# Patient Record
Sex: Female | Born: 1935
Health system: Southern US, Community
[De-identification: ages and names within clinical notes are randomized; demographics above are authoritative.]

## PROBLEM LIST (undated history)

## (undated) DIAGNOSIS — I10 Essential (primary) hypertension: Secondary | ICD-10-CM

## (undated) DIAGNOSIS — K219 Gastro-esophageal reflux disease without esophagitis: Secondary | ICD-10-CM

## (undated) DIAGNOSIS — F0390 Unspecified dementia without behavioral disturbance: Secondary | ICD-10-CM

## (undated) DIAGNOSIS — E7800 Pure hypercholesterolemia, unspecified: Secondary | ICD-10-CM

## (undated) DIAGNOSIS — E119 Type 2 diabetes mellitus without complications: Secondary | ICD-10-CM

## (undated) DIAGNOSIS — D649 Anemia, unspecified: Secondary | ICD-10-CM

## (undated) HISTORY — PX: CHOLECYSTECTOMY: SHX55

## (undated) HISTORY — DX: Gastro-esophageal reflux disease without esophagitis: K21.9

## (undated) HISTORY — DX: Anemia, unspecified: D64.9

## (undated) HISTORY — PX: ABDOMINAL HYSTERECTOMY: SHX81

## (undated) HISTORY — PX: JOINT REPLACEMENT: SHX530

---

## 1998-12-27 ENCOUNTER — Ambulatory Visit (HOSPITAL_COMMUNITY): Admission: RE | Admit: 1998-12-27 | Discharge: 1998-12-27 | Payer: Self-pay | Admitting: Ophthalmology

## 2008-07-29 ENCOUNTER — Emergency Department (HOSPITAL_BASED_OUTPATIENT_CLINIC_OR_DEPARTMENT_OTHER): Admission: EM | Admit: 2008-07-29 | Discharge: 2008-07-29 | Payer: Self-pay | Admitting: Emergency Medicine

## 2008-07-29 ENCOUNTER — Ambulatory Visit: Payer: Self-pay | Admitting: Diagnostic Radiology

## 2010-09-07 LAB — COMPREHENSIVE METABOLIC PANEL
AST: 29 U/L (ref 0–37)
BUN: 15 mg/dL (ref 6–23)
CO2: 28 mEq/L (ref 19–32)
Calcium: 8.9 mg/dL (ref 8.4–10.5)
Creatinine, Ser: 0.8 mg/dL (ref 0.4–1.2)
GFR calc Af Amer: >60 mL/min (ref >60)
GFR calc non Af Amer: >60 mL/min (ref >60)

## 2010-09-07 LAB — URINALYSIS, ROUTINE W REFLEX MICROSCOPIC
Nitrite: NEGATIVE
Specific Gravity, Urine: 1.007 (ref 1.005–1.030)
Urobilinogen, UA: 0.2 mg/dL (ref 0.0–1.0)
pH: 6 (ref 5.0–8.0)

## 2010-09-07 LAB — CBC
MCHC: 33.3 g/dL (ref 30.0–36.0)
MCV: 88.6 fL (ref 78.0–100.0)
Platelets: 148 10*3/uL — ABNORMAL LOW (ref 150–400)
RBC: 3.93 MIL/uL (ref 3.87–5.11)

## 2010-09-07 LAB — DIFFERENTIAL
Eosinophils Relative: 0 % (ref 0–5)
Lymphocytes Relative: 12 % (ref 12–46)
Lymphs Abs: 0.5 10*3/uL — ABNORMAL LOW (ref 0.7–4.0)
Neutro Abs: 3.3 10*3/uL (ref 1.7–7.7)
Neutrophils Relative %: 81 % — ABNORMAL HIGH (ref 43–77)

## 2010-09-07 LAB — LIPASE, BLOOD: Lipase: 143 U/L (ref 23–300)

## 2010-09-07 LAB — GLUCOSE, CAPILLARY: Glucose-Capillary: 222 mg/dL — ABNORMAL HIGH (ref 70–99)

## 2012-12-02 ENCOUNTER — Emergency Department (HOSPITAL_BASED_OUTPATIENT_CLINIC_OR_DEPARTMENT_OTHER)
Admission: EM | Admit: 2012-12-02 | Discharge: 2012-12-02 | Disposition: A | Discharge status: Home / Self Care | Payer: Medicare Other | Attending: Emergency Medicine | Admitting: Emergency Medicine

## 2012-12-02 ENCOUNTER — Encounter (HOSPITAL_BASED_OUTPATIENT_CLINIC_OR_DEPARTMENT_OTHER): Payer: Self-pay | Admitting: *Deleted

## 2012-12-02 ENCOUNTER — Emergency Department (HOSPITAL_BASED_OUTPATIENT_CLINIC_OR_DEPARTMENT_OTHER): Payer: Medicare Other

## 2012-12-02 DIAGNOSIS — S0180XA Unspecified open wound of other part of head, initial encounter: Secondary | ICD-10-CM | POA: Insufficient documentation

## 2012-12-02 DIAGNOSIS — Y92009 Unspecified place in unspecified non-institutional (private) residence as the place of occurrence of the external cause: Secondary | ICD-10-CM | POA: Insufficient documentation

## 2012-12-02 DIAGNOSIS — Z794 Long term (current) use of insulin: Secondary | ICD-10-CM | POA: Insufficient documentation

## 2012-12-02 DIAGNOSIS — Y9389 Activity, other specified: Secondary | ICD-10-CM | POA: Insufficient documentation

## 2012-12-02 DIAGNOSIS — S0990XA Unspecified injury of head, initial encounter: Secondary | ICD-10-CM | POA: Insufficient documentation

## 2012-12-02 DIAGNOSIS — Z79899 Other long term (current) drug therapy: Secondary | ICD-10-CM | POA: Insufficient documentation

## 2012-12-02 DIAGNOSIS — I10 Essential (primary) hypertension: Secondary | ICD-10-CM | POA: Insufficient documentation

## 2012-12-02 DIAGNOSIS — E7800 Pure hypercholesterolemia, unspecified: Secondary | ICD-10-CM | POA: Insufficient documentation

## 2012-12-02 DIAGNOSIS — Z7982 Long term (current) use of aspirin: Secondary | ICD-10-CM | POA: Insufficient documentation

## 2012-12-02 DIAGNOSIS — W19XXXA Unspecified fall, initial encounter: Secondary | ICD-10-CM

## 2012-12-02 DIAGNOSIS — R296 Repeated falls: Secondary | ICD-10-CM | POA: Insufficient documentation

## 2012-12-02 DIAGNOSIS — E119 Type 2 diabetes mellitus without complications: Secondary | ICD-10-CM | POA: Insufficient documentation

## 2012-12-02 DIAGNOSIS — Z23 Encounter for immunization: Secondary | ICD-10-CM | POA: Insufficient documentation

## 2012-12-02 HISTORY — DX: Type 2 diabetes mellitus without complications: E11.9

## 2012-12-02 HISTORY — DX: Essential (primary) hypertension: I10

## 2012-12-02 HISTORY — DX: Pure hypercholesterolemia, unspecified: E78.00

## 2012-12-02 MED ORDER — TETANUS-DIPHTH-ACELL PERTUSSIS 5-2.5-18.5 LF-MCG/0.5 IM SUSP
0.5000 mL | Freq: Once | INTRAMUSCULAR | Status: AC
  Administered 2012-12-02: 0.5 mL via INTRAMUSCULAR
  Filled 2012-12-02: qty 0.5

## 2012-12-02 NOTE — ED Provider Notes (Signed)
History    CSN: [id number] Arrival date & time 12/02/12  2039  First MD Initiated Contact with Patient 12/02/12 2201     Chief Complaint  Patient presents with  . Fall   (Consider location/radiation/quality/duration/timing/severity/associated sxs/prior Treatment) HPI Patient presents after a fall in her bathroom. She states that she lost her balance and fell. She was helped up by a family member. She did strike her head and has a laceration over her right eye. Bleeding was controlled prior to arrival. She has been at her mental status baseline according to family. No vomiting or seizure activity. She denies syncope or chest pain or severe headache prior to her fall. She does not remember her last tetanus shot. There are no other associated systemic symptoms, there are no other alleviating or modifying factors.  Past Medical History  Diagnosis Date  . Hypertension   . High cholesterol   . Diabetes mellitus without complication    Past Surgical History  Procedure Laterality Date  . Cholecystectomy    . Abdominal hysterectomy     No family history on file. History  Substance Use Topics  . Smoking status: Never Smoker   . Smokeless tobacco: Not on file  . Alcohol Use: No   OB History   Grav Para Term Preterm Abortions TAB SAB Ect Mult Living                 Review of Systems ROS reviewed and all otherwise negative except for mentioned in HPI  Allergies  Review of patient's allergies indicates no known allergies.  Home Medications   Current Outpatient Rx  Name  Route  Sig  Dispense  Refill  . aspirin 81 MG tablet   Oral   Take 81 mg by mouth daily.         Marland Kitchen atorvastatin (LIPITOR) 10 MG tablet   Oral   Take 10 mg by mouth daily.         Marland Kitchen b complex vitamins tablet   Oral   Take 1 tablet by mouth daily.         . bimatoprost (LUMIGAN) 0.01 % SOLN      1 drop at bedtime.         . brimonidine (ALPHAGAN P) 0.1 % SOLN               . dorzolamide  (TRUSOPT) 2 % ophthalmic solution      1 drop 3 (three) times daily.         Marland Kitchen gabapentin (NEURONTIN) 100 MG capsule   Oral   Take 100 mg by mouth 3 (three) times daily.         . insulin NPH-regular (NOVOLIN 70/30) (70-30) 100 UNIT/ML injection   Subcutaneous   Inject into the skin.         Marland Kitchen losartan (COZAAR) 100 MG tablet   Oral   Take 100 mg by mouth daily.         . methazolamide (NEPTAZANE) 50 MG tablet   Oral   Take 50 mg by mouth 3 (three) times daily.         . verapamil (COVERA HS) 180 MG (CO) 24 hr tablet   Oral   Take 180 mg by mouth at bedtime.          BP 122/53  Pulse 76  Temp(Src) 98.5 F (36.9 C) (Oral)  Resp 20  SpO2 100% Vitals reviewed Physical Exam Physical Examination: General appearance - alert, well  appearing, and in no distress Mental status - alert, oriented to person, place, and time Eyes - pupils equal and reactive, extraocular eye movements intact Mouth - mucous membranes moist, pharynx normal without lesions Neck - supple, no midline tenderness to palpation Chest - clear to auscultation, no wheezes, rales or rhonchi, symmetric air entry Heart - normal rate, regular rhythm, normal S1, S2, no murmurs, rubs, clicks or gallops Abdomen - soft, nontender, nondistended, no masses or organomegaly Back exam - no midline tenderness to palpation, no CVA tenderness Neurological - alert, oriented, normal speech, cranial nerves grossly intact, strength 5/5 in extremiteis x 4, sensation intact Musculoskeletal - no joint tenderness, deformity or swelling Extremities - peripheral pulses normal, no pedal edema, no clubbing or cyanosis Skin - approx 1cm horizontal laceration over right eyebrow, normal coloration and turgor, no rashes  ED Course  Procedures (including critical care time) Labs Reviewed - No data to display Ct Head Wo Contrast  12/02/2012   *RADIOLOGY REPORT*  Clinical Data: Fall, hit forehead.  Headache.  CT HEAD WITHOUT CONTRAST   Technique:  Contiguous axial images were obtained from the base of the skull through the vertex without contrast.  Comparison: None  Findings: There is atrophy and chronic small vessel disease changes. No acute intracranial abnormality.  Specifically, no hemorrhage, hydrocephalus, mass lesion, acute infarction, or significant intracranial injury.  No acute calvarial abnormality. Visualized paranasal sinuses and mastoids clear.  Orbital soft tissues unremarkable.  IMPRESSION: No acute intracranial abnormality.  Atrophy, chronic microvascular disease.   Original Report Authenticated By: Charlett Nose, M.D.   1. Fall, initial encounter   2. Head injury, initial encounter     MDM  Pt presenting with c/o fall.  She denies LOC.  She had CT of head without any acute changes.  Laceration irrigated and closed with dermabond.  No other evidence of injuries or other acute emergent conditions.  Discharged with strict return precautions.  Pt agreeable with plan.  Ethelda Chick, MD 12/04/12 [phone]

## 2012-12-02 NOTE — Discharge Instructions (Signed)
Return to the ED with any concerns including vomiting, seizure activity, fever, drainage from wound, increased redness around wound, decreased level of alertness/lethargy, or any other alarming symptoms

## 2012-12-02 NOTE — ED Notes (Signed)
unwitnessed fall. Family member found her on the floor with her cane under her. Small laceration above her right eye. No loc.

## 2013-02-21 ENCOUNTER — Inpatient Hospital Stay (HOSPITAL_BASED_OUTPATIENT_CLINIC_OR_DEPARTMENT_OTHER)
Admission: EM | Admit: 2013-02-21 | Discharge: 2013-02-24 | DRG: 085 | Disposition: A | Discharge status: Home / Self Care | Payer: Medicare Other | Attending: Neurosurgery | Admitting: Neurosurgery

## 2013-02-21 ENCOUNTER — Encounter (HOSPITAL_BASED_OUTPATIENT_CLINIC_OR_DEPARTMENT_OTHER): Payer: Self-pay

## 2013-02-21 ENCOUNTER — Emergency Department (HOSPITAL_BASED_OUTPATIENT_CLINIC_OR_DEPARTMENT_OTHER): Payer: Medicare Other

## 2013-02-21 DIAGNOSIS — W19XXXA Unspecified fall, initial encounter: Secondary | ICD-10-CM | POA: Diagnosis present

## 2013-02-21 DIAGNOSIS — E43 Unspecified severe protein-calorie malnutrition: Secondary | ICD-10-CM | POA: Insufficient documentation

## 2013-02-21 DIAGNOSIS — Z794 Long term (current) use of insulin: Secondary | ICD-10-CM

## 2013-02-21 DIAGNOSIS — Z7982 Long term (current) use of aspirin: Secondary | ICD-10-CM

## 2013-02-21 DIAGNOSIS — I10 Essential (primary) hypertension: Secondary | ICD-10-CM | POA: Diagnosis present

## 2013-02-21 DIAGNOSIS — S065XAA Traumatic subdural hemorrhage with loss of consciousness status unknown, initial encounter: Principal | ICD-10-CM | POA: Diagnosis present

## 2013-02-21 DIAGNOSIS — Z79899 Other long term (current) drug therapy: Secondary | ICD-10-CM

## 2013-02-21 DIAGNOSIS — IMO0002 Reserved for concepts with insufficient information to code with codable children: Secondary | ICD-10-CM

## 2013-02-21 DIAGNOSIS — Z9089 Acquired absence of other organs: Secondary | ICD-10-CM

## 2013-02-21 DIAGNOSIS — S065X9A Traumatic subdural hemorrhage with loss of consciousness of unspecified duration, initial encounter: Principal | ICD-10-CM | POA: Diagnosis present

## 2013-02-21 DIAGNOSIS — E119 Type 2 diabetes mellitus without complications: Secondary | ICD-10-CM | POA: Diagnosis present

## 2013-02-21 DIAGNOSIS — E7800 Pure hypercholesterolemia, unspecified: Secondary | ICD-10-CM | POA: Diagnosis present

## 2013-02-21 DIAGNOSIS — Y9210 Unspecified residential institution as the place of occurrence of the external cause: Secondary | ICD-10-CM | POA: Diagnosis present

## 2013-02-21 LAB — COMPREHENSIVE METABOLIC PANEL
ALT: <5 U/L (ref 0–35)
Albumin: 3.7 g/dL (ref 3.5–5.2)
Alkaline Phosphatase: 41 U/L (ref 39–117)
BUN: 20 mg/dL (ref 6–23)
Chloride: 101 mEq/L (ref 96–112)
GFR calc Af Amer: 89 mL/min — ABNORMAL LOW (ref >90)
Glucose, Bld: 253 mg/dL — ABNORMAL HIGH (ref 70–99)
Potassium: 3.9 mEq/L (ref 3.5–5.1)
Sodium: 137 mEq/L (ref 135–145)
Total Bilirubin: 0.4 mg/dL (ref 0.3–1.2)
Total Protein: 7.1 g/dL (ref 6.0–8.3)

## 2013-02-21 LAB — URINALYSIS, ROUTINE W REFLEX MICROSCOPIC
Bilirubin Urine: NEGATIVE
Glucose, UA: 500 mg/dL — AB
Ketones, ur: NEGATIVE mg/dL
Leukocytes, UA: NEGATIVE
Protein, ur: NEGATIVE mg/dL
pH: 7.5 (ref 5.0–8.0)

## 2013-02-21 LAB — CBC WITH DIFFERENTIAL/PLATELET
Hemoglobin: 9.5 g/dL — ABNORMAL LOW (ref 12.0–15.0)
Lymphs Abs: 1.2 10*3/uL (ref 0.7–4.0)
Monocytes Relative: 11 % (ref 3–12)
Neutro Abs: 3.1 10*3/uL (ref 1.7–7.7)
Neutrophils Relative %: 65 % (ref 43–77)
Platelets: 168 10*3/uL (ref 150–400)
RBC: 3.22 MIL/uL — ABNORMAL LOW (ref 3.87–5.11)
WBC: 4.8 10*3/uL (ref 4.0–10.5)

## 2013-02-21 NOTE — ED Provider Notes (Signed)
CSN: [id number]     Arrival date & time 02/21/13  2004 History  This chart was scribed for Rolan Bucco, MD by Greggory Stallion, ED Scribe. This patient was seen in room MH02/MH02 and the patient's care was started at 9:49 PM.   Chief Complaint  Patient presents with  . Fall   The history is provided by the patient. No language interpreter was used.    HPI Comments: Katelyn Suarez is a 79 y.o. female who presents to the Emergency Department complaining of two falls that occurred earlier today. She states she is having left great toe pain. Pt denies any other pain or hitting her head. Pt's great grandson states he found her about to eat a detergent pod so he's concerned about neurological problems. Pt's memory has been getting worse over time. Pt's great grandson states she has been seen by her PCP about her memory and was taken off some medications and started on a new medication. He states the patient has been falling more over the last few weeks. She does live at home alone but her relative stay with her on a regular basis. Pt denies difficulty urinating, abdominal pain, nausea, emesis, CP and SOB.    Past Medical History  Diagnosis Date  . Hypertension   . High cholesterol   . Diabetes mellitus without complication    Past Surgical History  Procedure Laterality Date  . Cholecystectomy    . Abdominal hysterectomy     No family history on file. History  Substance Use Topics  . Smoking status: Never Smoker   . Smokeless tobacco: Not on file  . Alcohol Use: No   OB History   Grav Para Term Preterm Abortions TAB SAB Ect Mult Living                 Review of Systems  Constitutional: Negative for fever, chills, diaphoresis and fatigue.  HENT: Negative for congestion, rhinorrhea and sneezing.   Eyes: Negative.   Respiratory: Negative for cough, chest tightness and shortness of breath.   Cardiovascular: Negative for chest pain and leg swelling.  Gastrointestinal: Negative for  nausea, vomiting, abdominal pain, diarrhea and blood in stool.  Genitourinary: Negative for frequency, hematuria, flank pain and difficulty urinating.  Musculoskeletal: Positive for arthralgias (left great toe). Negative for back pain.  Skin: Negative for rash.  Neurological: Negative for dizziness, speech difficulty, weakness, numbness and headaches.    Allergies  Lexapro  Home Medications   Current Outpatient Rx  Name  Route  Sig  Dispense  Refill  . aspirin 81 MG tablet   Oral   Take 81 mg by mouth daily.         Marland Kitchen atorvastatin (LIPITOR) 10 MG tablet   Oral   Take 10 mg by mouth daily.         Marland Kitchen b complex vitamins tablet   Oral   Take 1 tablet by mouth daily.         . bimatoprost (LUMIGAN) 0.01 % SOLN      1 drop at bedtime.         . brimonidine (ALPHAGAN P) 0.1 % SOLN               . dorzolamide (TRUSOPT) 2 % ophthalmic solution      1 drop 3 (three) times daily.         Marland Kitchen gabapentin (NEURONTIN) 100 MG capsule   Oral   Take 100 mg by mouth 3 (three)  times daily.         . insulin NPH-regular (NOVOLIN 70/30) (70-30) 100 UNIT/ML injection   Subcutaneous   Inject into the skin.         Marland Kitchen losartan (COZAAR) 100 MG tablet   Oral   Take 100 mg by mouth daily.         . methazolamide (NEPTAZANE) 50 MG tablet   Oral   Take 50 mg by mouth 3 (three) times daily.         . verapamil (COVERA HS) 180 MG (CO) 24 hr tablet   Oral   Take 180 mg by mouth at bedtime.          BP 176/59  Pulse 77  Temp(Src) 98 F (36.7 C) (Oral)  Resp 16  Ht 5\' 7"  (1.702 m)  Wt 125 lb (56.7 kg)  BMI 19.57 kg/m2  SpO2 100%  Physical Exam  Constitutional: She is oriented to person, place, and time. She appears well-developed and well-nourished.  HENT:  Head: Normocephalic and atraumatic.  Eyes: Pupils are equal, round, and reactive to light.  Neck: Normal range of motion. Neck supple.  Cardiovascular: Normal rate, regular rhythm and normal heart sounds.    Pulmonary/Chest: Effort normal and breath sounds normal. No respiratory distress. She has no wheezes. She has no rales. She exhibits no tenderness.  Abdominal: Soft. Bowel sounds are normal. There is no tenderness. There is no rebound and no guarding.  Musculoskeletal: Normal range of motion. She exhibits no edema.  No pain along spine. No pain on palpation or ROM on extremities, including the hips.   Lymphadenopathy:    She has no cervical adenopathy.  Neurological: She is alert and oriented to person, place, and time. She has normal strength. No cranial nerve deficit or sensory deficit. GCS eye subscore is 4. GCS verbal subscore is 4. GCS motor subscore is 6.  Alert and answers questions appropriately but confused. Finger to nose intact.   Skin: Skin is warm and dry. No rash noted.  Psychiatric: She has a normal mood and affect.    ED Course  Procedures (including critical care time)  DIAGNOSTIC STUDIES: Oxygen Saturation is 100% on RA, normal by my interpretation.    COORDINATION OF CARE: 9:59 PM-Discussed treatment plan which includes blood work, head CT and UTI with pt at bedside and pt agreed to plan. If everything is normal, advised family to have pt follow up with her doctor.   Labs Review Results for orders placed during the hospital encounter of 02/21/13  CBC WITH DIFFERENTIAL      Result Value Range   WBC 4.8  4.0 - 10.5 K/uL   RBC 3.22 (*) 3.87 - 5.11 MIL/uL   Hemoglobin 9.5 (*) 12.0 - 15.0 g/dL   HCT 16.1 (*) 09.6 - 04.5 %   MCV 89.1  78.0 - 100.0 fL   MCH 29.5  26.0 - 34.0 pg   MCHC 33.1  30.0 - 36.0 g/dL   RDW 40.9  81.1 - 91.4 %   Platelets 168  150 - 400 K/uL   Neutrophils Relative % 65  43 - 77 %   Neutro Abs 3.1  1.7 - 7.7 K/uL   Lymphocytes Relative 24  12 - 46 %   Lymphs Abs 1.2  0.7 - 4.0 K/uL   Monocytes Relative 11  3 - 12 %   Monocytes Absolute 0.5  0.1 - 1.0 K/uL   Eosinophils Relative 1  0 - 5 %  Eosinophils Absolute 0.0  0.0 - 0.7 K/uL    Basophils Relative 0  0 - 1 %   Basophils Absolute 0.0  0.0 - 0.1 K/uL  COMPREHENSIVE METABOLIC PANEL      Result Value Range   Sodium 137  135 - 145 mEq/L   Potassium 3.9  3.5 - 5.1 mEq/L   Chloride 101  96 - 112 mEq/L   CO2 25  19 - 32 mEq/L   Glucose, Bld 253 (*) 70 - 99 mg/dL   BUN 20  6 - 23 mg/dL   Creatinine, Ser 1.61  0.50 - 1.10 mg/dL   Calcium 9.7  8.4 - 09.6 mg/dL   Total Protein 7.1  6.0 - 8.3 g/dL   Albumin 3.7  3.5 - 5.2 g/dL   AST 14  0 - 37 U/L   ALT <5  0 - 35 U/L   Alkaline Phosphatase 41  39 - 117 U/L   Total Bilirubin 0.4  0.3 - 1.2 mg/dL   GFR calc non Af Amer 77 (*) >90 mL/min   GFR calc Af Amer 89 (*) >90 mL/min  URINALYSIS, ROUTINE W REFLEX MICROSCOPIC      Result Value Range   Color, Urine YELLOW  YELLOW   APPearance CLOUDY (*) CLEAR   Specific Gravity, Urine 1.017  1.005 - 1.030   pH 7.5  5.0 - 8.0   Glucose, UA 500 (*) NEGATIVE mg/dL   Hgb urine dipstick NEGATIVE  NEGATIVE   Bilirubin Urine NEGATIVE  NEGATIVE   Ketones, ur NEGATIVE  NEGATIVE mg/dL   Protein, ur NEGATIVE  NEGATIVE mg/dL   Urobilinogen, UA 1.0  0.0 - 1.0 mg/dL   Nitrite NEGATIVE  NEGATIVE   Leukocytes, UA NEGATIVE  NEGATIVE   Ct Head Wo Contrast  02/21/2013   *RADIOLOGY REPORT*  Clinical Data: Fall, confusion and altered mental status.  CT HEAD WITHOUT CONTRAST  Technique:  Contiguous axial images were obtained from the base of the skull through the vertex without contrast.  Comparison: 12/02/2012 head CT  Findings: A new 5 mm posterior right subdural hematoma is identified.  A small focus of increased density is noted in this subdural collection and compatible with a small area of acute hemorrhage on a larger subacute / chronic component.  Atrophy and chronic small vessel white matter ischemic changes are again noted. There is no evidence of midline shift, acute infarction or hydrocephalus.  The bony calvarium is unremarkable. Mucosal thickening within the left maxillary sinus noted as  well as post-traumatic/postsurgical changes of the right globe.  IMPRESSION: 5 mm posterior parietal subdural hematoma - new since 12/02/2012 and appears to have a small acute component on a larger subacute / chronic component.  Atrophy and chronic small vessel white matter ischemic changes.  Critical Value/emergent results were called by telephone at the time of interpretation on 02/21/2013 at 11:05 p.m. to Dr. Read Drivers, who verbally acknowledged these results.   Original Report Authenticated By: Harmon Pier, M.D.    Imaging Review Ct Head Wo Contrast  02/21/2013   *RADIOLOGY REPORT*  Clinical Data: Fall, confusion and altered mental status.  CT HEAD WITHOUT CONTRAST  Technique:  Contiguous axial images were obtained from the base of the skull through the vertex without contrast.  Comparison: 12/02/2012 head CT  Findings: A new 5 mm posterior right subdural hematoma is identified.  A small focus of increased density is noted in this subdural collection and compatible with a small area of acute hemorrhage on a  larger subacute / chronic component.  Atrophy and chronic small vessel white matter ischemic changes are again noted. There is no evidence of midline shift, acute infarction or hydrocephalus.  The bony calvarium is unremarkable. Mucosal thickening within the left maxillary sinus noted as well as post-traumatic/postsurgical changes of the right globe.  IMPRESSION: 5 mm posterior parietal subdural hematoma - new since 12/02/2012 and appears to have a small acute component on a larger subacute / chronic component.  Atrophy and chronic small vessel white matter ischemic changes.  Critical Value/emergent results were called by telephone at the time of interpretation on 02/21/2013 at 11:05 p.m. to Dr. Read Drivers, who verbally acknowledged these results.   Original Report Authenticated By: Harmon Pier, M.D.    MDM   1. Subdural hematoma    Patient was accepted by Dr. Jeral Fruit to the neurosurgical ICU     I  personally performed the services described in this documentation, which was scribed in my presence.  The recorded information has been reviewed and considered.   Rolan Bucco, MD 02/21/13 2350

## 2013-02-21 NOTE — ED Notes (Signed)
Great grandson reports patient has fallen two times today.  Unsure if patient hit head.  Pt denies pain or hitting head.  Pt is alert and oriented to self and situation but not time.

## 2013-02-21 NOTE — ED Notes (Signed)
I took patient to restroom via "Stedy" transport device, patient urinated well, I then helped patient back to bed, patient resting comfortably.

## 2013-02-22 ENCOUNTER — Inpatient Hospital Stay (HOSPITAL_COMMUNITY): Payer: Medicare Other

## 2013-02-22 LAB — MRSA PCR SCREENING: MRSA by PCR: NEGATIVE

## 2013-02-22 LAB — GLUCOSE, CAPILLARY
Glucose-Capillary: 186 mg/dL — ABNORMAL HIGH (ref 70–99)
Glucose-Capillary: 197 mg/dL — ABNORMAL HIGH (ref 70–99)
Glucose-Capillary: 218 mg/dL — ABNORMAL HIGH (ref 70–99)

## 2013-02-22 LAB — APTT: aPTT: 25 seconds (ref 24–37)

## 2013-02-22 MED ORDER — INSULIN ASPART 100 UNIT/ML ~~LOC~~ SOLN
0.0000 [IU] | Freq: Three times a day (TID) | SUBCUTANEOUS | Status: DC
  Administered 2013-02-22: 3 [IU] via SUBCUTANEOUS
  Administered 2013-02-22: 5 [IU] via SUBCUTANEOUS
  Administered 2013-02-22: 3 [IU] via SUBCUTANEOUS
  Administered 2013-02-23: 5 [IU] via SUBCUTANEOUS
  Administered 2013-02-23: 3 [IU] via SUBCUTANEOUS
  Administered 2013-02-24: 8 [IU] via SUBCUTANEOUS
  Administered 2013-02-24: 3 [IU] via SUBCUTANEOUS
  Administered 2013-02-24: 11 [IU] via SUBCUTANEOUS

## 2013-02-22 MED ORDER — HYDRALAZINE HCL 20 MG/ML IJ SOLN: 20.0000 mg | INTRAMUSCULAR | Status: DC | PRN

## 2013-02-22 NOTE — ED Notes (Signed)
Pt placed on cardiac monitor, bp and pulse ox.

## 2013-02-22 NOTE — Plan of Care (Signed)
Problem: Consults Goal: Intracranial Hemorrhage Patient Education See Patient Education Module for education specifics. Outcome: Progressing Observing patient, admitted from [facility] secondary to a fall, with 5mm post. Parietal SDH, no MLS. Goal: Diabetes Guidelines if Diabetic/Glucose > 140 If diabetic or lab glucose is > 140 mg/dl - Initiate Diabetes/Hyperglycemia Guidelines & Document Interventions  Outcome: Progressing CBGs obtained and monitored.  Problem: Acute Treatment Outcomes Goal: Neuro exam at baseline or improved Outcome: Progressing GCS-14. Reoriented, continuing to monitor.

## 2013-02-22 NOTE — ED Notes (Signed)
I have assisted patient to use restroom a total of five times in and out of bed, into "Stedy" etc. Patient has had good flow, no difficulty urinating.

## 2013-02-22 NOTE — H&P (Signed)
Mazal Judie Petit Eberlin is an 79 y.o. female.   Chief Complaint: sdh HPI: patient taken to [facility] because confusion. She had a ct head which showed a small sdh with no shift and because this finding she was send to [place] for further treatment. Back in 7/14 a ct head was negative  Past Medical History  Diagnosis Date  . Hypertension   . High cholesterol   . Diabetes mellitus without complication     Past Surgical History  Procedure Laterality Date  . Cholecystectomy    . Abdominal hysterectomy      No family history on file. Social History:  reports that she has never smoked. She does not have any smokeless tobacco history on file. She reports that she does not drink alcohol or use illicit drugs.  Allergies: No Known Allergies  Medications Prior to Admission  Medication Sig Dispense Refill  . escitalopram (LEXAPRO) 10 MG tablet Take 10 mg by mouth daily.      Marland Kitchen aspirin 81 MG tablet Take 81 mg by mouth daily.      Marland Kitchen atorvastatin (LIPITOR) 10 MG tablet Take 10 mg by mouth daily.      Marland Kitchen b complex vitamins tablet Take 1 tablet by mouth daily.      . bimatoprost (LUMIGAN) 0.01 % SOLN 1 drop at bedtime.      . brimonidine (ALPHAGAN P) 0.1 % SOLN       . dorzolamide (TRUSOPT) 2 % ophthalmic solution 1 drop 3 (three) times daily.      Marland Kitchen gabapentin (NEURONTIN) 100 MG capsule Take 100 mg by mouth 3 (three) times daily.      . insulin NPH-regular (NOVOLIN 70/30) (70-30) 100 UNIT/ML injection Inject into the skin.      Marland Kitchen losartan (COZAAR) 100 MG tablet Take 100 mg by mouth daily.      . methazolamide (NEPTAZANE) 50 MG tablet Take 50 mg by mouth 3 (three) times daily.      . verapamil (COVERA HS) 180 MG (CO) 24 hr tablet Take 180 mg by mouth at bedtime.        Results for orders placed during the hospital encounter of 02/21/13 (from the past 48 hour(s))  CBC WITH DIFFERENTIAL     Status: Abnormal   Collection Time    02/21/13 10:12 PM      Result Value Range   WBC 4.8  4.0 - 10.5 K/uL    RBC 3.22 (*) 3.87 - 5.11 MIL/uL   Hemoglobin 9.5 (*) 12.0 - 15.0 g/dL   HCT 96.0 (*) 45.4 - 09.8 %   MCV 89.1  78.0 - 100.0 fL   MCH 29.5  26.0 - 34.0 pg   MCHC 33.1  30.0 - 36.0 g/dL   RDW 11.9  14.7 - 82.9 %   Platelets 168  150 - 400 K/uL   Neutrophils Relative % 65  43 - 77 %   Neutro Abs 3.1  1.7 - 7.7 K/uL   Lymphocytes Relative 24  12 - 46 %   Lymphs Abs 1.2  0.7 - 4.0 K/uL   Monocytes Relative 11  3 - 12 %   Monocytes Absolute 0.5  0.1 - 1.0 K/uL   Eosinophils Relative 1  0 - 5 %   Eosinophils Absolute 0.0  0.0 - 0.7 K/uL   Basophils Relative 0  0 - 1 %   Basophils Absolute 0.0  0.0 - 0.1 K/uL  COMPREHENSIVE METABOLIC PANEL     Status: Abnormal  Collection Time    02/21/13 10:12 PM      Result Value Range   Sodium 137  135 - 145 mEq/L   Potassium 3.9  3.5 - 5.1 mEq/L   Chloride 101  96 - 112 mEq/L   CO2 25  19 - 32 mEq/L   Glucose, Bld 253 (*) 70 - 99 mg/dL   BUN 20  6 - 23 mg/dL   Creatinine, Ser 9.14  0.50 - 1.10 mg/dL   Calcium 9.7  8.4 - 78.2 mg/dL   Total Protein 7.1  6.0 - 8.3 g/dL   Albumin 3.7  3.5 - 5.2 g/dL   AST 14  0 - 37 U/L   ALT <5  0 - 35 U/L   Alkaline Phosphatase 41  39 - 117 U/L   Total Bilirubin 0.4  0.3 - 1.2 mg/dL   GFR calc non Af Amer 77 (*) >90 mL/min   GFR calc Af Amer 89 (*) >90 mL/min   Comment: (NOTE)     The eGFR has been calculated using the CKD EPI equation.     This calculation has not been validated in all clinical situations.     eGFR's persistently <90 mL/min signify possible Chronic Kidney     Disease.  URINALYSIS, ROUTINE W REFLEX MICROSCOPIC     Status: Abnormal   Collection Time    02/21/13 10:52 PM      Result Value Range   Color, Urine YELLOW  YELLOW   APPearance CLOUDY (*) CLEAR   Specific Gravity, Urine 1.017  1.005 - 1.030   pH 7.5  5.0 - 8.0   Glucose, UA 500 (*) NEGATIVE mg/dL   Hgb urine dipstick NEGATIVE  NEGATIVE   Bilirubin Urine NEGATIVE  NEGATIVE   Ketones, ur NEGATIVE  NEGATIVE mg/dL   Protein, ur  NEGATIVE  NEGATIVE mg/dL   Urobilinogen, UA 1.0  0.0 - 1.0 mg/dL   Nitrite NEGATIVE  NEGATIVE   Leukocytes, UA NEGATIVE  NEGATIVE   Comment: MICROSCOPIC NOT DONE ON URINES WITH NEGATIVE PROTEIN, BLOOD, LEUKOCYTES, NITRITE, OR GLUCOSE <1000 mg/dL.  PROTIME-INR     Status: None   Collection Time    02/22/13 12:00 AM      Result Value Range   Prothrombin Time 13.3  11.6 - 15.2 seconds   INR 1.03  0.00 - 1.49  APTT     Status: None   Collection Time    02/22/13 12:00 AM      Result Value Range   aPTT 25  24 - 37 seconds  MRSA PCR SCREENING     Status: None   Collection Time    02/22/13  2:19 AM      Result Value Range   MRSA by PCR NEGATIVE  NEGATIVE   Comment:            The GeneXpert MRSA Assay (FDA     approved for NASAL specimens     only), is one component of a     comprehensive MRSA colonization     surveillance program. It is not     intended to diagnose MRSA     infection nor to guide or     monitor treatment for     MRSA infections.  GLUCOSE, CAPILLARY     Status: Abnormal   Collection Time    02/22/13  2:32 AM      Result Value Range   Glucose-Capillary 197 (*) 70 - 99 mg/dL  GLUCOSE, CAPILLARY  Status: Abnormal   Collection Time    02/22/13  8:09 AM      Result Value Range   Glucose-Capillary 214 (*) 70 - 99 mg/dL   Comment 1 Notify RN     Ct Head Wo Contrast  02/22/2013   CLINICAL DATA:  79 year old female with confusion. Subdural hematoma. Subsequent encounter.  EXAM: CT HEAD WITHOUT CONTRAST  TECHNIQUE: Contiguous axial images were obtained from the base of the skull through the vertex without intravenous contrast.  COMPARISON:  02/21/2013 and earlier.  FINDINGS: Chronic changes to the right globe. Stable orbit and scalp soft tissues. Stable visualized osseous structures. Visualized paranasal sinuses and mastoids are clear.  Calcified atherosclerosis at the skull base. Decreasing small right posterior convexity mixed density extra-axial collection (series 2,  image 20). No significant mass effect on the right hemisphere. No midline shift.  No intraventricular hemorrhage. Stable ventricle size and configuration. No other intracranial mass lesion. Stable gray-white matter differentiation throughout the brain. Questionable small focus of encephalomalacia along the left sylvian fissure re- identified (series 2, image 14). No evidence of cortically based acute infarction identified. No suspicious intracranial vascular hyperdensity.  IMPRESSION: 1. Small and decreasing right posterior convexity subdural hematoma (series 2, image 20). No significant mass effect.  2.  Otherwise stable. No new intracranial abnormality.   Electronically Signed   By: Augusto Gamble M.D.   On: 02/22/2013 10:39   Ct Head Wo Contrast  02/21/2013   *RADIOLOGY REPORT*  Clinical Data: Fall, confusion and altered mental status.  CT HEAD WITHOUT CONTRAST  Technique:  Contiguous axial images were obtained from the base of the skull through the vertex without contrast.  Comparison: 12/02/2012 head CT  Findings: A new 5 mm posterior right subdural hematoma is identified.  A small focus of increased density is noted in this subdural collection and compatible with a small area of acute hemorrhage on a larger subacute / chronic component.  Atrophy and chronic small vessel white matter ischemic changes are again noted. There is no evidence of midline shift, acute infarction or hydrocephalus.  The bony calvarium is unremarkable. Mucosal thickening within the left maxillary sinus noted as well as post-traumatic/postsurgical changes of the right globe.  IMPRESSION: 5 mm posterior parietal subdural hematoma - new since 12/02/2012 and appears to have a small acute component on a larger subacute / chronic component.  Atrophy and chronic small vessel white matter ischemic changes.  Critical Value/emergent results were called by telephone at the time of interpretation on 02/21/2013 at 11:05 p.m. to Dr. Read Drivers, who verbally  acknowledged these results.   Original Report Authenticated By: Harmon Pier, M.D.    Review of Systems  Constitutional: Negative.   Eyes: Negative.   Respiratory: Negative.   Cardiovascular: Negative.   Gastrointestinal: Negative.   Genitourinary: Negative.   Musculoskeletal: Negative.   Skin: Negative.   Neurological: Positive for headaches.  Endo/Heme/Allergies: Negative.   Psychiatric/Behavioral: Positive for memory loss.    Blood pressure 157/55, pulse 84, temperature 98.1 F (36.7 C), temperature source Axillary, resp. rate 16, height 5\' 7"  (1.702 m), weight 56.7 kg (125 lb), SpO2 99.00%. Physical Exam hent, no evidence of trauma. Neck, nl. Cv, nl. Lugns, clear. Abdomen, soft. Extremities, nl. NEURO disoriented in time and space. Cn, wnl. Able to move all 4 extremities, sensory, wn. Ct smalll sdh with no shift  Assessment/Plan Observation. Not a surgical lesion. Will repeat the ct in am . Spoke with family members.  Robley Matassa M 02/22/2013, 10:52 AM

## 2013-02-22 NOTE — Progress Notes (Signed)
Sitter was giving patient a bath and noticed that the patient's bed pad was saturated with urine. I checked the fluid level in the foley balloon and drew back 9 mL. I advanced the foley catheter and put 10 mL into the balloon.  Will continue to monitor urine output to ensure that foley is effectively emptying the patient's bladder.

## 2013-02-23 ENCOUNTER — Inpatient Hospital Stay (HOSPITAL_COMMUNITY): Payer: Medicare Other

## 2013-02-23 LAB — GLUCOSE, CAPILLARY: Glucose-Capillary: 198 mg/dL — ABNORMAL HIGH (ref 70–99)

## 2013-02-23 MED ORDER — BOOST / RESOURCE BREEZE PO LIQD
1.0000 | Freq: Three times a day (TID) | ORAL | Status: DC
  Administered 2013-02-23 – 2013-02-24 (×4): 1 via ORAL

## 2013-02-23 NOTE — Progress Notes (Signed)
Patient ID: Katelyn Suarez, female   DOB: [birth year], 79 y.o.   MRN: [id number] Ct head no changes. Neuro stable. i did speak with a family member. To go to the floor and dc most likely in am

## 2013-02-23 NOTE — Progress Notes (Signed)
MD notified of patient's low UOP. No new orders at this time. Will encourage PO fluids.

## 2013-02-23 NOTE — Progress Notes (Signed)
INITIAL NUTRITION ASSESSMENT  DOCUMENTATION CODES Per approved criteria  -Severe malnutrition in the context of chronic illness   INTERVENTION: Resource Breeze po TID, each supplement provides 250 kcal and 9 grams of protein.  NUTRITION DIAGNOSIS: Malnutrition related to chronic illness as evidenced by 8% weight loss x 2 months, severe fat and muscle wasting, and intake <75% of her estimated needs in >/= 1 month.   Goal: Pt to meet >/= 90% of their estimated nutrition needs   Monitor:  PO intake, supplement acceptance, weight trend  Reason for Assessment: Pt identified as at nutrition risk on the Malnutrition Screen Tool  79 y.o. female  Admitting Dx: <principal problem not specified>  ASSESSMENT: Pt lives with grandson. Daughter at bedside and helped provide history. Pt's usual weight has been 138 lb she began to lose weight but between July and September pt lost 10 lbs. (loss of 8% of her body weight within 2 months). Pt has been eating poorly. Breakfast is usually egg and grits which pt has onl been eating 1/2 of. Lunch and Laural Golden is usually a vegetable plate from [employer] which she eats 1/2 of at [employer] and the other 1/2 at dinner. Per daughter she does not always finish the meal.  Pt has ensure at home that her family has encouraged her to consume but she does not like it. Pt does not like milk and feel they are too similar. Pt would be open to trying [employer].   Nutrition Focused Physical Exam:  Subcutaneous Fat:  Orbital Region: WNL Upper Arm Region: WNL Thoracic and Lumbar Region: severe wasting  Muscle:  Temple Region: severe wasting Clavicle Bone Region: severe wasting Clavicle and Acromion Bone Region: severe wasting Scapular Bone Region: severe wasting Dorsal Hand: severe wasting Patellar Region: mild-moderate wasting Anterior Thigh Region: mild-moderate wasting Posterior Calf Region: mild-moderate wasting  Edema: not present   Height: Ht Readings from  Last 1 Encounters:  02/21/13 5\' 7"  (1.702 m)    Weight: Wt Readings from Last 1 Encounters:  02/21/13 125 lb (56.7 kg)    Ideal Body Weight: 61.3 kg   % Ideal Body Weight: 92%  Wt Readings from Last 10 Encounters:  02/21/13 125 lb (56.7 kg)    Usual Body Weight: 138 lb  % Usual Body Weight: 91%  BMI:  Body mass index is 19.57 kg/(m^2).  Estimated Nutritional Needs: Kcal: 1400-1600 Protein: 70-80 grams Fluid: >1.5 L/day  Skin: abrasion left shin  Diet Order: Carb Control Meal Completion: <50%   EDUCATION NEEDS: -No education needs identified at this time   Intake/Output Summary (Last 24 hours) at 02/23/13 1113 Last data filed at 02/23/13 0800  Gross per 24 hour  Intake    100 ml  Output    500 ml  Net   -400 ml    Last BM: 9/27   Labs:   Recent Labs Lab 02/21/13 2212  NA 137  K 3.9  CL 101  CO2 25  BUN 20  CREATININE 0.70  CALCIUM 9.7  GLUCOSE 253*    CBG (last 3)   Recent Labs  02/22/13 2010 02/22/13 2344 02/23/13 0817  GLUCAP 177* 218* 198*    Scheduled Meds: . insulin aspart  0-15 Units Subcutaneous TID WC    Continuous Infusions:   Past Medical History  Diagnosis Date  . Hypertension   . High cholesterol   . Diabetes mellitus without complication     Past Surgical History  Procedure Laterality Date  . Cholecystectomy    .  Abdominal hysterectomy      Kendell BaneHeather Kariya Lavergne RD, LDN, CNSC [phone] Pager [phone] After Hours Pager

## 2013-02-23 NOTE — Progress Notes (Signed)
Inpatient Diabetes Program Recommendations  AACE/ADA: New Consensus Statement on Inpatient Glycemic Control (2013)  Target Ranges:  Prepandial:   less than 140 mg/dL      Peak postprandial:   less than 180 mg/dL (1-2 hours)      Critically ill patients:  140 - 180 mg/dL   Reason for Visit: Results for ALVINA, STROTHER (MRN [id number]) as of 02/23/2013 08:44  Ref. Range 02/22/2013 08:09 02/22/2013 11:52 02/22/2013 15:36 02/22/2013 20:10 02/22/2013 23:44  Glucose-Capillary Latest Range: 70-99 mg/dL 409 (H) 811 (H) 914 (H) 177 (H) 218 (H)   Please add Levemir 10 units daily while in the hospital.  Beryl Meager, RN, BC-ADM Inpatient Diabetes Coordinator Pager [phone]

## 2013-02-24 DIAGNOSIS — E43 Unspecified severe protein-calorie malnutrition: Secondary | ICD-10-CM | POA: Insufficient documentation

## 2013-02-24 LAB — GLUCOSE, CAPILLARY: Glucose-Capillary: 270 mg/dL — ABNORMAL HIGH (ref 70–99)

## 2013-02-24 NOTE — Progress Notes (Signed)
Patient is discharged from room 4N14 at this time. Alert and in stable condition. IV site d/c'd and also foley cath. Instructions read to both patient and great grandson and understanding verbalized. Great grandson signed instructions. Left facility via wheelchair and belongings with her.

## 2013-02-24 NOTE — Care Management Note (Signed)
    Page 1 of 1   02/24/2013     3:22:10 PM   CARE MANAGEMENT NOTE 02/24/2013  Patient:  LACHINA, SALSBERRY   Account Number:  [id number]  Date Initiated:  02/24/2013  Documentation initiated by:  Elmer Bales  Subjective/Objective Assessment:   Patient admitted for fall, SDH. Lives at home with family     Action/Plan:   Will follow for discharge needs.   Anticipated DC Date:  02/24/2013   Anticipated DC Plan:  HOME/SELF CARE      DC Planning Services  CM consult      Choice offered to / List presented to:             Status of service:  Completed, signed off Medicare Important Message given?   (If response is "NO", the following Medicare IM given date fields will be blank) Date Medicare IM given:   Date Additional Medicare IM given:    Discharge Disposition:  HOME/SELF CARE  Per UR Regulation:  Reviewed for med. necessity/level of care/duration of stay  If discussed at Long Length of Stay Meetings, dates discussed:    Comments:  02/24/13 1500 Elmer Bales RN, MSN, CM- Spoke with patient's RN, who states that family is requesting a wheelchair for home use.  Per Dr Jeral Fruit, patient may request this through her PCP.  Patient's son verified that patient has a cane and walker at home and has a follow-up appointment scheduled with her PCP next week.  Family does not feel that patient needs any additional services. Patient and family were encouraged to discuss any home needs with the PCP. RN updated.

## 2013-02-24 NOTE — Progress Notes (Signed)
Inpatient Diabetes Program Recommendations  AACE/ADA: New Consensus Statement on Inpatient Glycemic Control (2013)  Target Ranges:  Prepandial:   less than 140 mg/dL      Peak postprandial:   less than 180 mg/dL (1-2 hours)      Critically ill patients:  140 - 180 mg/dL     Results for ASSATA, JUNCAJ (MRN [id number]) as of 02/24/2013 12:34  Ref. Range 02/23/2013 08:17 02/23/2013 11:59 02/23/2013 17:12 02/23/2013 21:20  Glucose-Capillary Latest Range: 70-99 mg/dL 403 (H) 474 (H) 259 (H) 392 (H)    Results for ADELEINE, PASK (MRN [id number]) as of 02/24/2013 12:34  Ref. Range 02/24/2013 07:49 02/24/2013 11:46  Glucose-Capillary Latest Range: 70-99 mg/dL 329 (H) 518 (H)    **MD- Please add patient's home dose of 70/30 insulin- 15 units QAM with breakfast   Will follow. Ambrose Finland RN, MSN, CDE Diabetes Coordinator Inpatient Diabetes Program Team Pager: [phone] (8a-10p)

## 2013-02-24 NOTE — Evaluation (Signed)
Physical Therapy Evaluation Patient Details Name: Katelyn Suarez MRN: [id number] DOB: November 29, 1925 Today's Date: 02/24/2013 Time: 1914-7829 PT Time Calculation (min): 32 min  PT Assessment / Plan / Recommendation History of Present Illness  pt presents with multiple recent falls sustaining small SDH.    Clinical Impression  Pt's daughter present during eval and indicate plan is for pt to return to home.  Pt will need 24hr A and DME listed below.  During eval noted pt with visual deficits, unable to see daughter in room or recliner when it was within arms reach and sitting at pt's 10:00.  Would benefit from OT eval.      PT Assessment  Patient needs continued PT services    Follow Up Recommendations  Home health PT;Supervision/Assistance - 24 hour    Does the patient have the potential to tolerate intense rehabilitation      Barriers to Discharge        Equipment Recommendations  Rolling walker with 5" wheels;3in1 (PT);Wheelchair (measurements PT);Wheelchair cushion (measurements PT)    Recommendations for Other Services OT consult   Frequency Min 3X/week    Precautions / Restrictions Precautions Precautions: Fall Restrictions Weight Bearing Restrictions: No   Pertinent Vitals/Pain Denied pain.       Mobility  Bed Mobility Bed Mobility: Supine to Sit;Sitting - Scoot to Edge of Bed Supine to Sit: 3: Mod assist;HOB elevated Sitting - Scoot to Edge of Bed: 2: Max assist Details for Bed Mobility Assistance: cues for step-by-step through mobility, encouragement.   Transfers Transfers: Sit to Stand;Stand to [employer] Transfers Sit to Stand: 3: Mod assist;From bed Stand to Sit: 2: Max assist;To chair/3-in-1 Stand Pivot Transfers: 2: Max assist Details for Transfer Assistance: pt initially ModA to stand, but very fearful of falling and has strong posterior lean, pushing RW far in front of her.  Half way through transfer pt starts to sit prematurely and relies on PT to  complete pivot and lower pt to chair.   Ambulation/Gait Ambulation/Gait Assistance: Not tested (comment) Stairs: No Wheelchair Mobility Wheelchair Mobility: No Modified Rankin (Stroke Patients Only) Pre-Morbid Rankin Score: Moderately severe disability Modified Rankin: Severe disability    Exercises     PT Diagnosis: Difficulty walking;Generalized weakness  PT Problem List: Decreased strength;Decreased activity tolerance;Decreased balance;Decreased mobility;Decreased cognition;Decreased knowledge of use of DME;Decreased safety awareness PT Treatment Interventions: DME instruction;Gait training;Stair training;Functional mobility training;Therapeutic activities;Therapeutic exercise;Balance training;Neuromuscular re-education;Cognitive remediation;Patient/family education     PT Goals(Current goals can be found in the care plan section) Acute Rehab PT Goals Patient Stated Goal: Per daughter to bring pt home.   PT Goal Formulation: With family Time For Goal Achievement: 03/10/13 Potential to Achieve Goals: Fair  Visit Information  Last PT Received On: 02/24/13 Assistance Needed: +1 History of Present Illness: pt presents with multiple recent falls sustaining small SDH.         Prior Functioning  Home Living Family/patient expects to be discharged to:: Private residence Living Arrangements: Other relatives (Lives with Katelyn Suarez) Available Help at Discharge: Family;Available 24 hours/day Type of Home: House Home Access: Stairs to enter [employer] of Steps: 1 Entrance Stairs-Rails: None Home Layout: One level Home Equipment: Cane - single point;Tub bench Additional Comments: Katelyn Suarez works during the day and daughter comes to stay with pt during day.   Prior Function Level of Independence: Needs assistance Gait / Transfers Assistance Needed: Occasional A with bed mobility and ambulation.   ADL's / Homemaking Assistance Needed: Family performs all homemaking and  provides  A for bathing and dressing.   Communication Communication: HOH    Cognition  Cognition Arousal/Alertness: Awake/alert Behavior During Therapy: WFL for tasks assessed/performed Overall Cognitive Status: History of cognitive impairments - at baseline    Extremity/Trunk Assessment Upper Extremity Assessment Upper Extremity Assessment: Generalized weakness Lower Extremity Assessment Lower Extremity Assessment: Generalized weakness Cervical / Trunk Assessment Cervical / Trunk Assessment: Kyphotic   Balance Balance Balance Assessed: Yes Static Sitting Balance Static Sitting - Balance Support: Bilateral upper extremity supported;Feet supported Static Sitting - Level of Assistance: 5: Stand by assistance Static Standing Balance Static Standing - Balance Support: Bilateral upper extremity supported Static Standing - Level of Assistance: 3: Mod assist Static Standing - Comment/# of Minutes: pt fearful in standing and has strong posterior lean.    End of Session PT - End of Session Equipment Utilized During Treatment: Gait belt Activity Tolerance: Patient limited by fatigue Patient left: in chair;with call bell/phone within reach;with family/visitor present Nurse Communication: Mobility status  GP     Katelyn Suarez, [place] [phone] 02/24/2013, 8:34 AM

## 2013-02-24 NOTE — Progress Notes (Signed)
Stable neuro. No weakness . Ct stable . Plan to go home at the care of her family. Talked with her son

## 2013-02-24 NOTE — Discharge Summary (Signed)
Physician Discharge Summary  Patient ID: Katelyn Suarez MRN: [id number] DOB/AGE: 08-09-1925 79 y.o.  Admit date: 02/21/2013 Discharge date: 02/24/2013  Admission Diagnoses:small SDH  Discharge Diagnoses: same Active Problems:   Protein-calorie malnutrition, severe   Discharged Condition: unchanged  Hospital Course: observation  Consults: none  Significant Diagnostic Studies: ct head  Treatments: observation  Discharge Exam: Blood pressure 139/46, pulse 88, temperature 98.2 F (36.8 C), temperature source Oral, resp. rate 18, height 5\' 7"  (1.702 m), weight 56.7 kg (125 lb), SpO2 99.00%. Awake, no weakness. Slight confused as pre admission  Disposition: home  At the care of her family     Medication List    ASK your doctor about these medications       aspirin 81 MG tablet  Take 81 mg by mouth daily.     b complex vitamins tablet  Take 1 tablet by mouth daily.     COMBIGAN 0.2-0.5 % ophthalmic solution  Generic drug:  brimonidine-timolol  Place 1 drop into the left eye 2 (two) times daily.     escitalopram 10 MG tablet  Commonly known as:  LEXAPRO  Take 10 mg by mouth daily.     gabapentin 100 MG capsule  Commonly known as:  NEURONTIN  Take 100 mg by mouth 3 (three) times daily.     insulin NPH-regular (70-30) 100 UNIT/ML injection  Commonly known as:  NOVOLIN 70/30  Inject 15 Units into the skin daily with breakfast.     latanoprost 0.005 % ophthalmic solution  Commonly known as:  XALATAN  Place 1 drop into the left eye at bedtime.     methazolamide 25 MG tablet  Commonly known as:  NEPTAZANE  Take 50 mg by mouth 3 (three) times daily.     vitamin B-12 1000 MCG tablet  Commonly known as:  CYANOCOBALAMIN  Take 1,000 mcg by mouth daily.     Vitamin D (Ergocalciferol) 50000 UNITS Caps capsule  Commonly known as:  DRISDOL  Take 50,000 Units by mouth every 7 (seven) days.         Signed: [name] 02/24/2013, 2:56 PM

## 2013-03-02 LAB — GLUCOSE, CAPILLARY: Glucose-Capillary: 174 mg/dL — ABNORMAL HIGH (ref 70–99)

## 2014-03-12 ENCOUNTER — Encounter: Payer: Self-pay | Admitting: *Deleted

## 2014-03-12 ENCOUNTER — Non-Acute Institutional Stay (SKILLED_NURSING_FACILITY): Payer: Medicare Other | Admitting: Adult Health

## 2014-03-12 DIAGNOSIS — Z9181 History of falling: Secondary | ICD-10-CM

## 2014-03-12 DIAGNOSIS — N390 Urinary tract infection, site not specified: Secondary | ICD-10-CM

## 2014-03-12 DIAGNOSIS — Z9189 Other specified personal risk factors, not elsewhere classified: Secondary | ICD-10-CM

## 2014-03-12 DIAGNOSIS — E1140 Type 2 diabetes mellitus with diabetic neuropathy, unspecified: Secondary | ICD-10-CM | POA: Insufficient documentation

## 2014-03-12 DIAGNOSIS — I10 Essential (primary) hypertension: Secondary | ICD-10-CM

## 2014-03-12 DIAGNOSIS — G629 Polyneuropathy, unspecified: Secondary | ICD-10-CM

## 2014-03-12 NOTE — Progress Notes (Signed)
Patient ID: Katelyn Suarez, female   DOB: 1926-01-15, 80 y.o.   MRN: [id number]   03/12/2014  Facility:  Nursing Home Location:  [facility] Room Number: 801-1 LEVEL OF CARE:  SNF (31)   Chief Complaint  Patient presents with  . New Admit To SNF    HISTORY OF PRESENT ILLNESS:  This is an 80 year old female who has been admitted to [facility] on 03/11/14 from [facility] @ home sustaining a left frontal head hematoma. She Has been admitted for a short-term rehabilitation.  REASSESSMENT OF ONGOING PROBLEMS:  HTN: Pt 's HTN remains stable.  Denies CP, sob, DOE, pedal edema, headaches, dizziness or visual disturbances.  No complications from the medications currently being used.  Last BP :  PERIPHERAL NEUROPATHY: The peripheral neuropathy is stable. The patient denies pain in the feet, tingling, and numbness. No complications noted from the medication presently being used.  DM:pt's DM remains stable.  Pt denies polyuria, polydipsia, polyphagia, changes in vision or hypoglycemic episodes.  No complications noted from the medication presently being used.  Last hemoglobin A1c is: 7.7   PAST MEDICAL HISTORY:  Past Medical History  Diagnosis Date  . Hypertension   . High cholesterol   . Diabetes mellitus without complication     Type II  . Anemia   . GERD (gastroesophageal reflux disease)     CURRENT MEDICATIONS: Reviewed per MAR/see medication list  Allergies  Allergen Reactions  . Penicillins      REVIEW OF SYSTEMS:  GENERAL: no change in appetite, no fatigue, no weight changes, no fever, chills or weakness RESPIRATORY: no cough, SOB, DOE, wheezing, hemoptysis CARDIAC: no chest pain, edema or palpitations GI: no abdominal pain, diarrhea, constipation, heart burn, nausea or vomiting  PHYSICAL EXAMINATION  GENERAL: no acute distress, normal body habitus, left frontal head hematoma EYES: conjunctivae normal,  sclerae normal, normal eye lids, left eye brusing NECK: supple, trachea midline, no neck masses, no thyroid tenderness, no thyromegaly LYMPHATICS: no LAN in the neck, no supraclavicular LAN RESPIRATORY: breathing is even & unlabored, BS CTAB CARDIAC: RRR, no murmur,no extra heart sounds, no edema GI: abdomen soft, normal BS, no masses, no tenderness, no hepatomegaly, no splenomegaly EXTREMITIES:  Able to move all 4 extremities PSYCHIATRIC: the patient is alert & oriented to person, affect & behavior appropriate  LABS/RADIOLOGY: 03/09/14  WBC 3.8 hemoglobin 9.2 hematocrit 27.2 MCV 81.7 sodium 141 potassium 3.6 glucose 156 BUN 19 creatinine 0.67 calcium 8.2  ASSESSMENT/PLAN:  Status post fall - for rehabilitation UTI - continue Omnicef Hypertension - continue lisinopril  Diabetes mellitus, type II - continue NPH 70/30 and Humalog sliding scale Neuropathy - continue Neurontin   CPT CODE: [id number]    [facility] Center[facility]-VARGAS,Pascha Fogal, NP [facility] [phone]

## 2014-03-16 ENCOUNTER — Non-Acute Institutional Stay (SKILLED_NURSING_FACILITY): Payer: Medicare Other | Admitting: Internal Medicine

## 2014-03-16 DIAGNOSIS — G629 Polyneuropathy, unspecified: Secondary | ICD-10-CM

## 2014-03-16 DIAGNOSIS — I10 Essential (primary) hypertension: Secondary | ICD-10-CM

## 2014-03-16 DIAGNOSIS — E0590 Thyrotoxicosis, unspecified without thyrotoxic crisis or storm: Secondary | ICD-10-CM

## 2014-03-16 DIAGNOSIS — E1140 Type 2 diabetes mellitus with diabetic neuropathy, unspecified: Secondary | ICD-10-CM

## 2014-03-19 DIAGNOSIS — E0590 Thyrotoxicosis, unspecified without thyrotoxic crisis or storm: Secondary | ICD-10-CM | POA: Insufficient documentation

## 2014-03-19 NOTE — Progress Notes (Signed)
        HISTORY & PHYSICAL  DATE: 03/16/2014   FACILITY: [facility]  LEVEL OF CARE: SNF (31)  ALLERGIES:  Allergies  Allergen Reactions  . Penicillins     CHIEF COMPLAINT:  Manage HTN, DM & neuropathy  HISTORY OF PRESENT ILLNESS:80 y/o AAF was hospitalized after sustaining a fall.  After hospitalization patient is admitted to this facility for short term rehabilitation.  HTN: Pt 's HTN remains stable.  Denies CP, sob, DOE, pedal edema, headaches, dizziness or visual disturbances.  No complications from the medications currently being used.  Last BP :149/59.  DM:pt's DM remains stable.  Pt denies polyuria, polydipsia, polyphagia, changes in vision or hypoglycemic episodes.  No complications noted from the medication presently being used.  Last hemoglobin A1c is not available.  PERIPHERAL NEUROPATHY: The peripheral neuropathy is stable. The patient denies pain in the feet, tingling, and numbness. No complications noted from the medication presently being used.  PAST MEDICAL HISTORY :  Past Medical History  Diagnosis Date  . Hypertension   . High cholesterol   . Diabetes mellitus without complication     Type II  . Anemia   . GERD (gastroesophageal reflux disease)     PAST SURGICAL HISTORY: Past Surgical History  Procedure Laterality Date  . Cholecystectomy    . Abdominal hysterectomy    . Joint replacement      right hip    SOCIAL HISTORY:  reports that she has never smoked. She does not have any smokeless tobacco history on file. She reports that she does not drink alcohol or use illicit drugs.  FAMILY HISTORY: None  CURRENT MEDICATIONS: Reviewed per MAR/see medication list  REVIEW OF SYSTEMS:  See HPI otherwise 14 point ROS is negative.  PHYSICAL EXAMINATION  VS:  See VS section  GENERAL: no acute distress, normal body habitus, left forehead hematoma, left facial bruising EYES: conjunctivae normal, sclerae normal, normal eye  lids MOUTH/THROAT: lips without lesions,no lesions in the mouth,tongue is without lesions,uvula elevates in midline NECK: supple, trachea midline, no neck masses, no thyroid tenderness, no thyromegaly LYMPHATICS: no LAN in the neck, no supraclavicular LAN RESPIRATORY: breathing is even & unlabored, BS CTAB CARDIAC: RRR, no murmur,no extra heart sounds, no edema GI:  ABDOMEN: abdomen soft, normal BS, no masses, no tenderness  LIVER/SPLEEN: no hepatomegaly, no splenomegaly MUSCULOSKELETAL: HEAD: normal to inspection  EXTREMITIES: LEFT UPPER EXTREMITY: minimal range of motion, normal strength & tone RIGHT UPPER EXTREMITY: moderate range of motion, normal strength & tone LEFT LOWER EXTREMITY:  Moderate range of motion, normal strength & tone RIGHT LOWER EXTREMITY: moderate  range of motion, normal strength & tone PSYCHIATRIC: the patient is alert & oriented to person, affect & behavior appropriate  LABS/RADIOLOGY:  03-09-14 wbc 3.8, Hb 9.2, mcv 81.7, plt 142, K 3.6, glc 156 ow bmp nl, Urine Cx: E. Coli, TSH 1 03-08-14 Head CT- large left forehead hematoma  ASSESSMENT/PLAN:  DM-cont. NPH insulin Peripheral neuropathy-cont. neurontin HTN-BP borderline.  Will monitor. Hyperthyroidism-cont. Methimazole UTI-completed omnicef Check cbc & bmp  I have reviewed patient's medical records received at admission/from hospitalization.  CPT CODE: [id number]  Angela CoxGayani Y Alma Mohiuddin, MD [facility] [phone]

## 2014-03-31 ENCOUNTER — Non-Acute Institutional Stay (SKILLED_NURSING_FACILITY): Payer: Medicare Other | Admitting: Adult Health

## 2014-03-31 ENCOUNTER — Encounter: Payer: Self-pay | Admitting: Adult Health

## 2014-03-31 DIAGNOSIS — E1140 Type 2 diabetes mellitus with diabetic neuropathy, unspecified: Secondary | ICD-10-CM

## 2014-03-31 DIAGNOSIS — I10 Essential (primary) hypertension: Secondary | ICD-10-CM

## 2014-03-31 DIAGNOSIS — F32A Depression, unspecified: Secondary | ICD-10-CM

## 2014-03-31 DIAGNOSIS — F329 Major depressive disorder, single episode, unspecified: Secondary | ICD-10-CM

## 2014-03-31 DIAGNOSIS — G629 Polyneuropathy, unspecified: Secondary | ICD-10-CM

## 2014-03-31 NOTE — Progress Notes (Signed)
Patient ID: Katelyn Suarez, female   DOB: 1925-06-06, 80 y.o.   MRN: [id number]   03/31/2014  Facility:  Nursing Home Location:  [facility] Room Number: 801-1 LEVEL OF CARE:  SNF (31)   Chief Complaint  Patient presents with  . Discharge Note    Hypertension, Diabetes Mellitus, type 2, Peripheral Neuropathy, Depression and Anemia     HISTORY OF PRESENT ILLNESS:  This is an 80 year old female who is for discharge home with Home health PT, OT, Nursing and CNA. [facility] @ home sustaining a left frontal head hematoma. Patient was admitted to this facility for short-term rehabilitation after the patient's recent hospitalization.  Patient has completed SNF rehabilitation and therapy has cleared the patient for discharge.  REASSESSMENT OF ONGOING PROBLEMS:  HTN: Pt 's HTN remains stable.  Denies CP, sob, DOE, pedal edema, headaches, dizziness or visual disturbances.  No complications from the medications currently being used.  Last BP : 135/56  DEPRESSION: The depression remains stable. Patient denies ongoing feelings of sadness, insomnia, anedhonia or lack of appetite. No complications reported from the medications currently being used. Staff do not report behavioral problems.  ANEMIA: The anemia has been stable. The patient denies fatigue, melena or hematochezia. No complications from the medications currently being used. 10/15 hgb 9.8  PAST MEDICAL HISTORY:  Past Medical History  Diagnosis Date  . Hypertension   . High cholesterol   . Diabetes mellitus without complication     Type II  . Anemia   . GERD (gastroesophageal reflux disease)     CURRENT MEDICATIONS: Reviewed per MAR/see medication list  Allergies  Allergen Reactions  . Penicillins      REVIEW OF SYSTEMS:  GENERAL: no change in appetite, no fatigue, no weight changes, no fever, chills or  weakness RESPIRATORY: no cough, SOB, DOE, wheezing, hemoptysis CARDIAC: no chest pain, edema or palpitations GI: no abdominal pain, diarrhea, constipation, heart burn, nausea or vomiting  PHYSICAL EXAMINATION  GENERAL: no acute distress, normal body habitus  SKIN:  left frontal head hematoma, left facial bruising NECK: supple, trachea midline, no neck masses, no thyroid tenderness, no thyromegaly LYMPHATICS: no LAN in the neck, no supraclavicular LAN RESPIRATORY: breathing is even & unlabored, BS CTAB CARDIAC: RRR, no murmur,no extra heart sounds, no edema GI: abdomen soft, normal BS, no masses, no tenderness, no hepatomegaly, no splenomegaly EXTREMITIES:  Able to move all 4 extremities PSYCHIATRIC: the patient is alert & oriented to person, affect & behavior appropriate  LABS/RADIOLOGY: 03/17/14  WBC 4.8 hemoglobin 9.8 hematocrit 30.5 MCV 87.1 sodium 137 potassium 4.5 glucose 114 BUN 18 creatinine 0.6 calcium 9.6 03/09/14  WBC 3.8 hemoglobin 9.2 hematocrit 27.2 MCV 81.7 sodium 141 potassium 3.6 glucose 156 BUN 19 creatinine 0.67 calcium 8.2  ASSESSMENT/PLAN:  Hypertension - well controlled; continue lisinopril and Neptazane Diabetes mellitus, type II - continue NPH 70/30 and Humalog sliding scale Neuropathy - continue Neurontin Depression - continue Lexapro Anemia, acute blood loss - stable;  hgb 9.8  I have filled out patient's discharge paperwork and written prescriptions.  Patient will receive home health PT, OT, Nursing and CNA.  Total discharge time: Less than 30 minutes  Discharge tiIme involved coordination of the discharge process with social worker, nursing staff and therapy department. Medical justification for home health services verified.    CPT CODE: [id number]    [facility],Wren Pryce, NP [facility]  [phone]    

## 2014-09-20 ENCOUNTER — Non-Acute Institutional Stay (SKILLED_NURSING_FACILITY): Payer: Medicare Other | Admitting: Adult Health

## 2014-09-20 ENCOUNTER — Encounter: Payer: Self-pay | Admitting: Adult Health

## 2014-09-20 DIAGNOSIS — F0390 Unspecified dementia without behavioral disturbance: Secondary | ICD-10-CM

## 2014-09-20 DIAGNOSIS — B372 Candidiasis of skin and nail: Secondary | ICD-10-CM | POA: Diagnosis not present

## 2014-09-20 DIAGNOSIS — E43 Unspecified severe protein-calorie malnutrition: Secondary | ICD-10-CM | POA: Diagnosis not present

## 2014-09-20 DIAGNOSIS — H409 Unspecified glaucoma: Secondary | ICD-10-CM | POA: Diagnosis not present

## 2014-09-20 DIAGNOSIS — N390 Urinary tract infection, site not specified: Secondary | ICD-10-CM

## 2014-09-20 DIAGNOSIS — D509 Iron deficiency anemia, unspecified: Secondary | ICD-10-CM

## 2014-09-20 DIAGNOSIS — E1140 Type 2 diabetes mellitus with diabetic neuropathy, unspecified: Secondary | ICD-10-CM

## 2014-09-20 DIAGNOSIS — I10 Essential (primary) hypertension: Secondary | ICD-10-CM

## 2014-09-20 DIAGNOSIS — L89153 Pressure ulcer of sacral region, stage 3: Secondary | ICD-10-CM

## 2014-09-20 DIAGNOSIS — R5381 Other malaise: Secondary | ICD-10-CM | POA: Diagnosis not present

## 2014-09-20 DIAGNOSIS — G629 Polyneuropathy, unspecified: Secondary | ICD-10-CM

## 2014-09-20 NOTE — Progress Notes (Signed)
Patient ID: Katelyn Suarez, female   DOB: 07/20/1925, 81 y.o.   MRN: [id number]   09/20/2014  Facility:  Nursing Home Location:  [facility] Room Number: 301-2 LEVEL OF CARE:  SNF (31)   Chief Complaint  Patient presents with  . Hospitalization Follow-up    Physical deconditioning, UTI, Diabetes mellitus, Neuropathy, Anemia, Candida, Hypertension, Dementia, Glaucoma, Protein-calorie malnutrition and Sacral decubitus    HISTORY OF PRESENT ILLNESS:  This is an 81 year old female who was been admitted to [facility] on 09/16/14 from [facility]. She has PMH of diabetes mellitus type 2, dementia, GERD and hypertension. She went to the hospital with chief complaints of left hand and knee pain. She was then being treated with Levaquin for UTI and was continued in the hospital. In the hospital, she was treated for candida of groin and buttocks with Nystatin powder. Her insulin was adjusted due to hypoglycemic episode during hospitalization.  She has been admitted for a short-term rehabilitation.  PAST MEDICAL HISTORY:  Past Medical History  Diagnosis Date  . Hypertension   . High cholesterol   . Diabetes mellitus without complication     Type II  . Anemia   . GERD (gastroesophageal reflux disease)     CURRENT MEDICATIONS: Reviewed per MAR/see medication list  Allergies  Allergen Reactions  . Penicillins      REVIEW OF SYSTEMS:  GENERAL: no change in appetite, no fatigue, no weight changes, no fever, chills or weakness RESPIRATORY: no cough, SOB, DOE, wheezing, hemoptysis CARDIAC: no chest pain, edema or palpitations GI: no abdominal pain, diarrhea, constipation, heart burn, nausea or vomiting  PHYSICAL EXAMINATION  GENERAL: no acute distress, normal body habitus SKIN:  Sacral ulcers stage 3, bilateral groin redness EYES: conjunctivae normal, sclerae normal, normal eye lids NECK: supple, trachea midline, no neck masses, no  thyroid tenderness, no thyromegaly LYMPHATICS: no LAN in the neck, no supraclavicular LAN RESPIRATORY: breathing is even & unlabored, BS CTAB CARDIAC: RRR, no murmur,no extra heart sounds, no edema GI: abdomen soft, normal BS, no masses, no tenderness, no hepatomegaly, no splenomegaly EXTREMITIES:  LLE and LUE weakness; left and right fingers are contracted PSYCHIATRIC: the patient is alert & oriented to person, affect & behavior appropriate  LABS/RADIOLOGY: Labs reviewed: 09/14/14  sodium 140 potassium 3.8 glucose 160 BUN 16 creatinine 0.63 calcium 8.6 anion gap 15 GFR >90 WBC 5.0 hemoglobin 10.9 hematocrit 33.2 MCV 84.7 09/11/14  chest x-ray shows no acute cardiopulmonary disease; sodium 138 potassium 4.1 glucose 247 BUN 11 creatinine 0.59 calcium 9.0 total protein 7.2 albumin 3.0 SGOT 16 SGPT 5 alkaline phosphatase 60 total bilirubin 0.3 and iron 12.0 GFR >90   ASSESSMENT/PLAN:  Physical deconditioning - for rehabilitation UTI - recently finished Levaquin course; no hematuria nor fever Diabetes mellitus, type II - continue Humalog 2 units subcutaneous with meals, Humalog sliding scale before meals and at bedtime and Lantus 8 units subcutaneous daily at bedtime Neuropathy - continue Neurontin 100 mg by mouth 3 times a day Anemia - hemoglobin 10.9; continue ferrous sulfate 325 mg 1 tab by mouth daily Candida groin - continue nystatin powder Hypertension  - well-controlled; continue metoprolol tartrate 25 mg by mouth twice a day and lisinopril 5 mg by mouth twice a day Dementia - continue Namenda 5 mg by mouth twice a day Glaucoma - no complaints of eye pain; continue methazolamide 50 mg by mouth daily; Lumigan, Alphagan and dorzolamide eyedrops Protein calorie malnutrition - albumin 3.0;  RD consult; continue supplementation Sacral decubitus, stage 3 - continue treatment and monitor for infection    Goals of care:  Short-term rehabilitation   Labs/test ordered:  CBC, hgbA1c and  BMP  Spent 50 minutes in patient care.   [facility],MONINA, NP [facility] [phone]

## 2014-09-21 ENCOUNTER — Non-Acute Institutional Stay (SKILLED_NURSING_FACILITY): Payer: Medicare Other | Admitting: Internal Medicine

## 2014-09-21 DIAGNOSIS — F0390 Unspecified dementia without behavioral disturbance: Secondary | ICD-10-CM | POA: Diagnosis not present

## 2014-09-21 DIAGNOSIS — R29898 Other symptoms and signs involving the musculoskeletal system: Secondary | ICD-10-CM | POA: Diagnosis not present

## 2014-09-21 DIAGNOSIS — G629 Polyneuropathy, unspecified: Secondary | ICD-10-CM

## 2014-09-21 DIAGNOSIS — N3000 Acute cystitis without hematuria: Secondary | ICD-10-CM | POA: Diagnosis not present

## 2014-09-21 DIAGNOSIS — R5381 Other malaise: Secondary | ICD-10-CM | POA: Diagnosis not present

## 2014-09-21 DIAGNOSIS — L98429 Non-pressure chronic ulcer of back with unspecified severity: Secondary | ICD-10-CM

## 2014-09-21 DIAGNOSIS — E1140 Type 2 diabetes mellitus with diabetic neuropathy, unspecified: Secondary | ICD-10-CM | POA: Diagnosis not present

## 2014-09-21 DIAGNOSIS — I10 Essential (primary) hypertension: Secondary | ICD-10-CM | POA: Diagnosis not present

## 2014-09-21 DIAGNOSIS — D638 Anemia in other chronic diseases classified elsewhere: Secondary | ICD-10-CM

## 2014-09-21 DIAGNOSIS — B379 Candidiasis, unspecified: Secondary | ICD-10-CM

## 2014-09-21 DIAGNOSIS — E46 Unspecified protein-calorie malnutrition: Secondary | ICD-10-CM

## 2014-09-21 DIAGNOSIS — L89153 Pressure ulcer of sacral region, stage 3: Secondary | ICD-10-CM | POA: Diagnosis not present

## 2014-09-21 DIAGNOSIS — M24542 Contracture, left hand: Secondary | ICD-10-CM | POA: Diagnosis not present

## 2014-09-21 NOTE — Progress Notes (Signed)
Patient ID: Katelyn QuarryFlossie M Sorber, female   DOB: July 21, 1925, 81 y.o.   MRN: [id number]     [facility]   PCP: No primary care provider on file.  Code Status: DNR  Allergies  Allergen Reactions  . Penicillins     Chief Complaint  Patient presents with  . New Admit To SNF     HPI:  81 year old patient is here for short term rehabilitation post hospital admission from 09/11/14- 09/16/14 with left knee and hand pain with joint swelling. She was noted to have UTI and upper extremity contractures. She was then treated for UTI, candidal rash in groin area and stage 3 sacral ulcer. She also had hypoglycemia in the hospital and her hypoglycemic agents were adjusted. PT was consulted and recommended SNF. She has PMH of dementia, HTN, DM type 2. She is seen in her room today. Her daughter is present at bedside. She denies any concerns. She appears frail and mentions being tired. Denies any pain.  Review of Systems:  Constitutional: Negative for fever, chills, diaphoresis.  HENT: Negative for headache, congestion, nasal discharge Eyes: Negative for eye pain, blurred vision, double vision and discharge.  Respiratory: Negative for cough, shortness of breath and wheezing.   Cardiovascular: Negative for chest pain, palpitations, leg swelling.  Gastrointestinal: Negative for heartburn, nausea, vomiting, abdominal pain.  Genitourinary: Negative for dysuria Musculoskeletal: Negative for back pain, falls in facility Skin: Negative for itching, rash.  Neurological: Negative for dizziness, headache Psychiatric/Behavioral: Negative for depression. has memory loss.    Past Medical History  Diagnosis Date  . Hypertension   . High cholesterol   . Diabetes mellitus without complication     Type II  . Anemia   . GERD (gastroesophageal reflux disease)    Past Surgical History  Procedure Laterality Date  . Cholecystectomy    . Abdominal hysterectomy    . Joint  replacement      right hip   Social History:   reports that she has never smoked. She does not have any smokeless tobacco history on file. She reports that she does not drink alcohol or use illicit drugs.  No family history on file.  Medications: Patient's Medications  New Prescriptions   No medications on file  Previous Medications   ASPIRIN 81 MG TABLET    Take 81 mg by mouth daily.   BIMATOPROST (LUMIGAN) 0.03 % OPHTHALMIC SOLUTION    Place 1 drop into both eyes at bedtime.   BRIMONIDINE (ALPHAGAN P) 0.1 % SOLN    Place 1 drop into the left eye 3 (three) times daily.   DORZOLAMIDE (TRUSOPT) 2 % OPHTHALMIC SOLUTION    Place 1 drop into the left eye 3 (three) times daily.    GABAPENTIN (NEURONTIN) 100 MG CAPSULE    Take 100 mg by mouth 3 (three) times daily.    INSULIN GLARGINE (LANTUS) 100 UNIT/ML INJECTION    Inject 8 Units into the skin at bedtime.   INSULIN LISPRO (HUMALOG) 100 UNIT/ML INJECTION    Inject 2 Units into the skin 3 (three) times daily before meals.   INSULIN LISPRO (HUMALOG) 100 UNIT/ML INJECTION    Inject into the skin 4 (four) times daily -  before meals and at bedtime. Sliding scale: 150-199=1 unit; 200-249=2 units; 250-299=3 units; 300-349=4 units; >349 5 units and notify MD   LISINOPRIL (PRINIVIL,ZESTRIL) 5 MG TABLET    Take 5 mg by mouth 2 (two) times daily.   MEMANTINE (NAMENDA)  5 MG TABLET    Take 5 mg by mouth 2 (two) times daily.   METHAZOLAMIDE (NEPTAZANE) 25 MG TABLET    Take 50 mg by mouth daily.    METOPROLOL TARTRATE (LOPRESSOR) 25 MG TABLET    Take 25 mg by mouth 2 (two) times daily.  Modified Medications   No medications on file  Discontinued Medications   No medications on file     Physical Exam:  Filed Vitals:   09/21/14 1031  BP: 137/83  Pulse: 95  Temp: 97 F (36.1 C)  Resp: 18  Weight: 125 lb 6.4 oz (56.881 kg)  SpO2: 96%    General- elderly female, frail, in no acute distress Head- normocephalic, atraumatic Throat- moist mucus  membrane Eyes- PERRLA, EOMI, no pallor, no icterus, no discharge, normal conjunctiva, normal sclera Neck- no cervical lymphadenopathy Cardiovascular- normal s1,s2, no murmurs, no leg edema Respiratory- bilateral clear to auscultation, no wheeze, no rhonchi, no crackles, no use of accessory muscles Abdomen- bowel sounds present, soft, non tender Musculoskeletal- able to move all 4 extremities, left hand contracture with contracture of fingers, can be straightened but painful Neurological- no focal deficit Skin- warm and dry, sacral ulcer stage 3 Psychiatry- alert and oriented to person, normal mood and affect    Labs reviewed: 09/14/14  sodium 140 potassium 3.8 glucose 160 BUN 16 creatinine 0.63 calcium 8.6 anion gap 15 GFR >90 WBC 5.0 hemoglobin 10.9 hematocrit 33.2 MCV 84.7 09/11/14  chest x-ray shows no acute cardiopulmonary disease; sodium 138 potassium 4.1 glucose 247 BUN 11 creatinine 0.59 calcium 9.0 total protein 7.2 albumin 3.0 SGOT 16 SGPT 5 alkaline phosphatase 60 total bilirubin 0.3 and iron 12.0 GFR >90   Assessment/Plan  Physical deconditioning Will have patient work with PT/OT as tolerated to regain strength and restore function.  Fall precautions are in place.  Left hand contracture and left arm weakness To work with therapy team to help restore/ maximize function  Joint swelling As per daughter in hospital, unclear of etiology, no swelling at present. Check uric acid level to rule out gout  UTI Completed course of antibiotics, currently asymptomatic. Hydration encouraged  Protein calorie malnutrition Encourage po intake, monitor weight, continue skin care  Sacral ulcer stage 3 Continue wound care. Add decubivite daily for now. Pressure u;cer prophylaxis  Dementia Decline anticipated, continue namenda 5 mg bid. Continue assistance with ADLs, fall precuations, pressure ulcer prophylaxis  Candidiasis In groin area, continue skin care and nystatin  powder  Diabetes mellitus with neuropathy Monitor cbg,  Continue lantus 8 u and SSI premeal humalog  Hypertension  continue metoprolol tartrate 25 mg bid and lisinopril 5 mg bid for now, monitor bp readings  Neuropathy continue Neurontin 100 mg tid  Anemia  continue ferrous sulfate 325 mg daily   Goals of care: short term rehabilitation   Labs/tests ordered: cbc, a1c, cmp, uric acid  Family/ staff Communication: reviewed care plan with patient and nursing supervisor    Oneal Grout, MD  [facility] [phone] (Monday-Friday 8 am - 5 pm) [phone] (afterhours)

## 2014-10-05 ENCOUNTER — Encounter: Payer: Self-pay | Admitting: Adult Health

## 2014-10-05 ENCOUNTER — Non-Acute Institutional Stay (SKILLED_NURSING_FACILITY): Payer: Medicare Other | Admitting: Adult Health

## 2014-10-05 DIAGNOSIS — D509 Iron deficiency anemia, unspecified: Secondary | ICD-10-CM | POA: Diagnosis not present

## 2014-10-05 DIAGNOSIS — L89153 Pressure ulcer of sacral region, stage 3: Secondary | ICD-10-CM

## 2014-10-05 DIAGNOSIS — I10 Essential (primary) hypertension: Secondary | ICD-10-CM

## 2014-10-05 DIAGNOSIS — E43 Unspecified severe protein-calorie malnutrition: Secondary | ICD-10-CM

## 2014-10-05 DIAGNOSIS — E1140 Type 2 diabetes mellitus with diabetic neuropathy, unspecified: Secondary | ICD-10-CM

## 2014-10-05 DIAGNOSIS — H409 Unspecified glaucoma: Secondary | ICD-10-CM

## 2014-10-05 DIAGNOSIS — F0390 Unspecified dementia without behavioral disturbance: Secondary | ICD-10-CM

## 2014-10-05 DIAGNOSIS — G629 Polyneuropathy, unspecified: Secondary | ICD-10-CM

## 2014-10-05 DIAGNOSIS — R5381 Other malaise: Secondary | ICD-10-CM | POA: Diagnosis not present

## 2014-10-05 NOTE — Progress Notes (Signed)
Patient ID: Katelyn Suarez, female   DOB: [birth year], 81 y.o.   MRN: [id number]   10/05/2014  Facility:  Nursing Home Location:  [facility] Room Number: 901-2 LEVEL OF CARE:  SNF (31)   Chief Complaint  Patient presents with  . Discharge Note    Physical deconditioning, Diabetes mellitus, Neuropathy, Anemia, Candida, Hypertension, Dementia, Glaucoma, Protein-calorie malnutrition and Sacral decubitus    HISTORY OF PRESENT ILLNESS:  This is an 81 year old female who is for discharge home with home health PT for endurance, OT for ADLs, speech for cognition, Nursing for wound care and CNA for showers. DME:  High back recliner wheelchair with leg rests and anti-tippers. She has been [facility] 09/16/14 from [facility]. She has PMH of diabetes mellitus type 2, dementia, GERD and hypertension. She went to the hospital with chief complaints of left hand and knee pain. She was then being treated with Levaquin for UTI and was continued in the hospital. In the hospital, she was treated for candida of groin and buttocks with Nystatin powder. Her insulin was adjusted due to hypoglycemic episode during hospitalization.  Patient was admitted to this facility for short-term rehabilitation after the patient's recent hospitalization.  Patient has completed SNF rehabilitation and therapy has cleared the patient for discharge.  PAST MEDICAL HISTORY:  Past Medical History  Diagnosis Date  . Hypertension   . High cholesterol   . Diabetes mellitus without complication     Type II  . Anemia   . GERD (gastroesophageal reflux disease)     CURRENT MEDICATIONS: Reviewed per MAR/see medication list  Allergies  Allergen Reactions  . Penicillins      REVIEW OF SYSTEMS:  GENERAL: no change in appetite, no fatigue, no weight changes, no fever, chills or weakness RESPIRATORY: no cough, SOB, DOE, wheezing, hemoptysis CARDIAC: no chest pain,  edema or palpitations GI: no abdominal pain, diarrhea, constipation, heart burn, nausea or vomiting  PHYSICAL EXAMINATION  GENERAL: no acute distress, normal body habitus SKIN:  Sacral ulcers stage 3 NECK: supple, trachea midline, no neck masses, no thyroid tenderness, no thyromegaly LYMPHATICS: no LAN in the neck, no supraclavicular LAN RESPIRATORY: breathing is even & unlabored, BS CTAB CARDIAC: RRR, no murmur,no extra heart sounds, no edema GI: abdomen soft, normal BS, no masses, no tenderness, no hepatomegaly, no splenomegaly EXTREMITIES:  LLE and LUE weakness; left and right fingers are contracted PSYCHIATRIC: the patient is alert & oriented to person, affect & behavior appropriate  LABS/RADIOLOGY: Labs reviewed: 09/22/14  uric acid 5.1 09/21/14  WBC 4.2 hemoglobin 9.6 hematocrit 30.7 MCV 86.2 sodium 138 potassium 4.5 glucose 143 BUN 19 creatinine 0.83 calcium 8.8 hemoglobin A1c 9.3 09/14/14  sodium 140 potassium 3.8 glucose 160 BUN 16 creatinine 0.63 calcium 8.6 anion gap 15 GFR >90 WBC 5.0 hemoglobin 10.9 hematocrit 33.2 MCV 84.7 09/11/14  chest x-ray shows no acute cardiopulmonary disease; sodium 138 potassium 4.1 glucose 247 BUN 11 creatinine 0.59 calcium 9.0 total protein 7.2 albumin 3.0 SGOT 16 SGPT 5 alkaline phosphatase 60 total bilirubin 0.3 and iron 12.0 GFR >90   ASSESSMENT/PLAN:  Physical deconditioning - for rehabilitation Diabetes mellitus, type II - continue Humalog 2 units subcutaneous with meals, Humalog sliding scale before meals and at bedtime and Lantus 8 units subcutaneous daily at bedtime Neuropathy - continue Neurontin 100 mg by mouth 3 times a day Anemia - hemoglobin 9.3; continue ferrous sulfate 325 mg 1 tab by mouth daily Hypertension  -  well-controlled; continue metoprolol tartrate 25 mg by mouth twice a day and lisinopril 5 mg by mouth twice a day Dementia - continue Namenda 5 mg by mouth twice a day Glaucoma - no complaints of eye pain; continue  methazolamide 50 mg by mouth daily; Lumigan, Alphagan and dorzolamide eyedrops Protein calorie malnutrition - albumin 3.0; continue supplementation Sacral decubitus, stage 3 - continue treatment and monitor for infection    I have filled out patient's discharge paperwork and written prescriptions.  Patient will receive home health PT, OT, ST, Nursing and CNA.  DME provided:  High back recliner wheelchair with leg rests and anti-tippers  Total discharge time: Greater than 30 minutes  Discharge time involved coordination of the discharge process with social worker, nursing staff and therapy department. Medical justification for home health services/DME verified.   [facility],Katelyn Bridgett, NP [facility] [phone]

## 2015-08-09 ENCOUNTER — Encounter: Payer: Self-pay | Admitting: Podiatry

## 2015-08-09 ENCOUNTER — Ambulatory Visit (INDEPENDENT_AMBULATORY_CARE_PROVIDER_SITE_OTHER): Payer: Medicare Other | Admitting: Podiatry

## 2015-08-09 VITALS — Ht 64.0 in | Wt 125.0 lb

## 2015-08-09 DIAGNOSIS — B351 Tinea unguium: Secondary | ICD-10-CM

## 2015-08-09 DIAGNOSIS — M79606 Pain in leg, unspecified: Secondary | ICD-10-CM

## 2015-08-09 DIAGNOSIS — L97301 Non-pressure chronic ulcer of unspecified ankle limited to breakdown of skin: Secondary | ICD-10-CM | POA: Diagnosis not present

## 2015-08-09 NOTE — Progress Notes (Signed)
SUBJECTIVE: 82 y.o. year old female presents accompanied by her daughter for diabetic foot care. Patient uses wheel chair. Stated that her left ankle has sore sport, and big toes hurt in shoes.   REVIEW OF SYSTEMS: A comprehensive review of systems was negative.  OBJECTIVE: DERMATOLOGIC EXAMINATION: Nails: Thick dystrophic nails on both great toes. Dark circular pre ulcerative skin lesion on left lateral ankle. Dark brown spot on right lateral heel.   VASCULAR EXAMINATION OF LOWER LIMBS: Pedal pulses: All pedal pulses are not palpable on both feet.   NEUROLOGIC EXAMINATION OF THE LOWER LIMBS: All epicritic and tactile sensations grossly intact.  MUSCULOSKELETAL EXAMINATION: Multiple contracted digits on both feet.  ASSESSMENT: 1. Onychomycosis both great toes. 2. Pre ulcerative decubitus left lateral ankle. 3. Pre ulcerative ecchymosis right lateral heel.  PLAN: Reviewed findings and available treatment options. All nails debrided. Left lateral ankle padded. Patient is to keep long round tube pillow under the leg in bed to avoid any pressure to the left lateral ankle. Return in 3 months or sooner if needed.

## 2015-08-09 NOTE — Patient Instructions (Signed)
Seen for hypertrophic nails and left ankle lesion. All nails debrided. Pad placed on left ankle. Instructed to keep ankle off of bed by placing long tube pillow under leg to avoid any pressure.  Return in 3 months or as needed.

## 2015-11-09 ENCOUNTER — Ambulatory Visit: Payer: Medicare Other | Admitting: Podiatry

## 2015-11-15 ENCOUNTER — Encounter: Payer: Self-pay | Admitting: Podiatry

## 2015-11-15 ENCOUNTER — Ambulatory Visit (INDEPENDENT_AMBULATORY_CARE_PROVIDER_SITE_OTHER): Payer: Medicare Other | Admitting: Podiatry

## 2015-11-15 DIAGNOSIS — M79606 Pain in leg, unspecified: Secondary | ICD-10-CM | POA: Diagnosis not present

## 2015-11-15 DIAGNOSIS — B351 Tinea unguium: Secondary | ICD-10-CM | POA: Diagnosis not present

## 2015-11-15 NOTE — Progress Notes (Signed)
SUBJECTIVE: 82 y.o. year old female presents accompanied by her daughter for diabetic foot care. Patient uses wheel chair.  REVIEW OF SYSTEMS: A comprehensive review of systems was negative.  OBJECTIVE: DERMATOLOGIC EXAMINATION: Nails: Thick dystrophic nails on both great toes. Left lateral malleoli is covered with soft cushion. Right bunion site has 0.5 cm diameter scab with loss of soft tissue padding between the skin and bone.  VASCULAR EXAMINATION OF LOWER LIMBS: Pedal pulses: All pedal pulses are not palpable on both feet.   NEUROLOGIC EXAMINATION OF THE LOWER LIMBS: All epicritic and tactile sensations grossly intact.  MUSCULOSKELETAL EXAMINATION: Multiple contracted digits on both feet.  ASSESSMENT: 1. Onychomycosis both great toes. 2. Pre ulcerative lesion right bunion.  PLAN: Reviewed findings and available treatment options. All nails debrided. Advised to keep soft padding over right foot bunion site.

## 2015-11-15 NOTE — Patient Instructions (Signed)
Seen for routine foot care. All nails debrided. Noted of pre ulcerative area over right bunion. May use loose slipper or padding over the bunion.  Return in 3 months or as needed.

## 2016-02-15 ENCOUNTER — Ambulatory Visit: Payer: Medicare Other | Admitting: Podiatry

## 2016-02-21 ENCOUNTER — Encounter: Payer: Self-pay | Admitting: Podiatry

## 2016-02-21 ENCOUNTER — Ambulatory Visit (INDEPENDENT_AMBULATORY_CARE_PROVIDER_SITE_OTHER): Payer: Medicare Other | Admitting: Podiatry

## 2016-02-21 DIAGNOSIS — M79676 Pain in unspecified toe(s): Secondary | ICD-10-CM | POA: Diagnosis not present

## 2016-02-21 DIAGNOSIS — L03031 Cellulitis of right toe: Secondary | ICD-10-CM

## 2016-02-21 DIAGNOSIS — B351 Tinea unguium: Secondary | ICD-10-CM | POA: Diagnosis not present

## 2016-02-21 DIAGNOSIS — M79606 Pain in leg, unspecified: Secondary | ICD-10-CM

## 2016-02-21 DIAGNOSIS — L03032 Cellulitis of left toe: Secondary | ICD-10-CM

## 2016-02-21 NOTE — Patient Instructions (Signed)
Seen for hypertrophic nails. Noted of draining nail bed on right great toe. All nails debrided. Removed affected nail plate and cleansed with Iodine. Iodine dressing applied with instruction to keep it covered right great toe. Return if drainage continues. Otherwise return in 3 months or as needed.

## 2016-02-21 NOTE — Progress Notes (Signed)
SUBJECTIVE: 82 y.o. year old female presents accompanied by her daughter requesting toe nails trimmed. Patient is alert and responsive to question. Patient uses wheel chair. Denies any acute new problems.   OBJECTIVE: DERMATOLOGIC EXAMINATION: Nails: Thick dystrophic nails on both great toes. Draining nail bed with sinus track from distal to proximal 1/2 of nail bed. No associated edema or erythema noted.   VASCULAR EXAMINATION OF LOWER LIMBS: All pedal pulses are not palpable on both feet.   NEUROLOGIC EXAMINATION OF THE LOWER LIMBS: All epicritic and tactile sensations grossly intact.  MUSCULOSKELETAL EXAMINATION: Multiple contracted digits on both feet.  ASSESSMENT: 1. Onychomycosis both great toes. 2. Draining nail bed right hallux.   PLAN: Reviewed findings and available treatment options. All nails debrided. Removed distal 1/2 of nail plate on right. Area cleansed with Iodine and Iodine wet to dry dressing applied. Instructed to her daughter to clean the nail bed with H2O2 or Iodine after each bath till the lesion dries out.  Return in 3 months or sooner of the nail bed fails to heal on right great toe.

## 2016-02-22 ENCOUNTER — Emergency Department (HOSPITAL_BASED_OUTPATIENT_CLINIC_OR_DEPARTMENT_OTHER)
Admission: EM | Admit: 2016-02-22 | Discharge: 2016-02-22 | Disposition: A | Discharge status: Home / Self Care | Payer: Medicare Other | Attending: Emergency Medicine | Admitting: Emergency Medicine

## 2016-02-22 ENCOUNTER — Encounter (HOSPITAL_BASED_OUTPATIENT_CLINIC_OR_DEPARTMENT_OTHER): Payer: Self-pay

## 2016-02-22 DIAGNOSIS — Z7982 Long term (current) use of aspirin: Secondary | ICD-10-CM | POA: Diagnosis not present

## 2016-02-22 DIAGNOSIS — E119 Type 2 diabetes mellitus without complications: Secondary | ICD-10-CM | POA: Diagnosis not present

## 2016-02-22 DIAGNOSIS — B373 Candidiasis of vulva and vagina: Secondary | ICD-10-CM | POA: Diagnosis not present

## 2016-02-22 DIAGNOSIS — N760 Acute vaginitis: Secondary | ICD-10-CM | POA: Insufficient documentation

## 2016-02-22 DIAGNOSIS — N898 Other specified noninflammatory disorders of vagina: Secondary | ICD-10-CM | POA: Diagnosis present

## 2016-02-22 DIAGNOSIS — Z794 Long term (current) use of insulin: Secondary | ICD-10-CM | POA: Diagnosis not present

## 2016-02-22 DIAGNOSIS — I10 Essential (primary) hypertension: Secondary | ICD-10-CM | POA: Diagnosis not present

## 2016-02-22 DIAGNOSIS — B9689 Other specified bacterial agents as the cause of diseases classified elsewhere: Secondary | ICD-10-CM

## 2016-02-22 DIAGNOSIS — B3731 Acute candidiasis of vulva and vagina: Secondary | ICD-10-CM

## 2016-02-22 DIAGNOSIS — Z79899 Other long term (current) drug therapy: Secondary | ICD-10-CM | POA: Diagnosis not present

## 2016-02-22 HISTORY — DX: Unspecified dementia, unspecified severity, without behavioral disturbance, psychotic disturbance, mood disturbance, and anxiety: F03.90

## 2016-02-22 LAB — BASIC METABOLIC PANEL
Anion gap: 6 (ref 5–15)
BUN: 22 mg/dL — AB (ref 6–20)
CALCIUM: 8.8 mg/dL — AB (ref 8.9–10.3)
CO2: 27 mmol/L (ref 22–32)
Chloride: 105 mmol/L (ref 101–111)
Creatinine, Ser: 0.93 mg/dL (ref 0.44–1.00)
GFR calc Af Amer: >60 mL/min (ref >60)
GFR, EST NON AFRICAN AMERICAN: 53 mL/min — AB (ref >60)
GLUCOSE: 307 mg/dL — AB (ref 65–99)
Potassium: 4.9 mmol/L (ref 3.5–5.1)
Sodium: 138 mmol/L (ref 135–145)

## 2016-02-22 LAB — CBC WITH DIFFERENTIAL/PLATELET
BASOS ABS: 0 10*3/uL (ref 0.0–0.1)
BASOS PCT: 0 %
EOS ABS: 0 10*3/uL (ref 0.0–0.7)
Eosinophils Relative: 1 %
HCT: 32.3 % — ABNORMAL LOW (ref 36.0–46.0)
HEMOGLOBIN: 10.5 g/dL — AB (ref 12.0–15.0)
Lymphocytes Relative: 23 %
Lymphs Abs: 1.2 10*3/uL (ref 0.7–4.0)
MCH: 27.6 pg (ref 26.0–34.0)
MCHC: 32.5 g/dL (ref 30.0–36.0)
MCV: 84.8 fL (ref 78.0–100.0)
Monocytes Absolute: 0.5 10*3/uL (ref 0.1–1.0)
Monocytes Relative: 9 %
NEUTROS PCT: 67 %
Neutro Abs: 3.5 10*3/uL (ref 1.7–7.7)
Platelets: 171 10*3/uL (ref 150–400)
RBC: 3.81 MIL/uL — AB (ref 3.87–5.11)
RDW: 14.3 % (ref 11.5–15.5)
WBC: 5.2 10*3/uL (ref 4.0–10.5)

## 2016-02-22 LAB — URINALYSIS, ROUTINE W REFLEX MICROSCOPIC
Bilirubin Urine: NEGATIVE
Glucose, UA: >1000 mg/dL — AB
Ketones, ur: 15 mg/dL — AB
Nitrite: NEGATIVE
Protein, ur: 30 mg/dL — AB
SPECIFIC GRAVITY, URINE: 1.023 (ref 1.005–1.030)
pH: 5 (ref 5.0–8.0)

## 2016-02-22 LAB — WET PREP, GENITAL
SPERM: NONE SEEN
TRICH WET PREP: NONE SEEN
YEAST WET PREP: NONE SEEN

## 2016-02-22 LAB — URINE MICROSCOPIC-ADD ON

## 2016-02-22 MED ORDER — MICONAZOLE NITRATE 2 % EX CREA: 1.0000 "application " | TOPICAL_CREAM | Freq: Two times a day (BID) | CUTANEOUS | 0 refills | Status: AC

## 2016-02-22 MED ORDER — METRONIDAZOLE 500 MG PO TABS: 500.0000 mg | ORAL_TABLET | Freq: Two times a day (BID) | ORAL | 0 refills | Status: AC

## 2016-02-22 MED ORDER — FLUCONAZOLE 200 MG PO TABS: 200.0000 mg | ORAL_TABLET | Freq: Every day | ORAL | 0 refills | Status: AC

## 2016-02-22 MED FILL — metroNIDAZOLE 500 MG TABS: 500 | 7 days supply | Qty: 14 | Fill #0

## 2016-02-22 MED FILL — FLUCONAZOLE 200 MG TABLET: 200 | 3 days supply | Qty: 3 | Fill #0

## 2016-02-22 NOTE — ED Provider Notes (Signed)
[facility] Provider Note   CSN: [id number] Arrival date & time: 02/22/16  1300     History   Chief Complaint Chief Complaint  Patient presents with  . Vaginal Discharge    HPI Katelyn Suarez is a 82 y.o. female.  Patient is a 82 year old female with history of diabetes, hypertension, dementia presents the ED accompanied by her daughter with complaint of vaginal discharge, onset 1-2 weeks. Patient lives at home with her daughter who cares for her. Daughter reports she has noticed the patient having white vaginal discharge with a red rash to her groin for the past few weeks. Patient reports she has been complaining of vaginal discomfort and pain when urine touches affected area for the past few weeks. Denies fever, chills, CP, SOB, cough, abdominal pain, N/V/D, urinary sxs, blood in urine or stool, vaginal bleeding, AMS. Daughter reports pt has been at her normal baseline mental status. Denies any recent falls. Pt uses at wheelchair at home at baseline to ambulate.       Past Medical History:  Diagnosis Date  . Anemia   . Dementia   . Diabetes mellitus without complication (HCC)    Type II  . GERD (gastroesophageal reflux disease)   . High cholesterol   . Hypertension     Patient Active Problem List   Diagnosis Date Noted  . Onychomycosis 08/09/2015  . Hyperthyroidism 03/19/2014  . Essential hypertension 03/12/2014  . Diabetes mellitus with neuropathy (HCC) 03/12/2014  . Protein-calorie malnutrition, severe (HCC) 02/24/2013    Past Surgical History:  Procedure Laterality Date  . ABDOMINAL HYSTERECTOMY    . CHOLECYSTECTOMY    . JOINT REPLACEMENT     right hip    OB History    No data available       Home Medications    Prior to Admission medications   Medication Sig Start Date End Date Taking? Authorizing Provider  aspirin 81 MG tablet Take 81 mg by mouth daily.    Historical Provider, MD  bimatoprost (LUMIGAN) 0.03 % ophthalmic solution  Place 1 drop into both eyes at bedtime.    Historical Provider, MD  brimonidine (ALPHAGAN P) 0.1 % SOLN Place 1 drop into the left eye 3 (three) times daily.    Historical Provider, MD  dorzolamide (TRUSOPT) 2 % ophthalmic solution Place 1 drop into the left eye 3 (three) times daily.     Historical Provider, MD  fluconazole (DIFLUCAN) 200 MG tablet Take 1 tablet (200 mg total) by mouth daily. 02/22/16 02/25/16  Barrett HenleNicole Elizabeth Fuad Forget, PA-C  gabapentin (NEURONTIN) 100 MG capsule Take 100 mg by mouth 3 (three) times daily.     Historical Provider, MD  insulin glargine (LANTUS) 100 UNIT/ML injection Inject 8 Units into the skin at bedtime.    Historical Provider, MD  insulin lispro (HUMALOG) 100 UNIT/ML injection Inject 2 Units into the skin 3 (three) times daily before meals.    Historical Provider, MD  insulin lispro (HUMALOG) 100 UNIT/ML injection Inject into the skin 4 (four) times daily -  before meals and at bedtime. Sliding scale: 150-199=1 unit; 200-249=2 units; 250-299=3 units; 300-349=4 units; >349 5 units and notify MD    Historical Provider, MD  lisinopril (PRINIVIL,ZESTRIL) 5 MG tablet Take 5 mg by mouth 2 (two) times daily.    Historical Provider, MD  memantine (NAMENDA) 5 MG tablet Take 5 mg by mouth 2 (two) times daily.    Historical Provider, MD  methazolamide (NEPTAZANE) 25 MG tablet Take  50 mg by mouth daily.     Historical Provider, MD  metoprolol tartrate (LOPRESSOR) 25 MG tablet Take 25 mg by mouth 2 (two) times daily.    Historical Provider, MD  metroNIDAZOLE (FLAGYL) 500 MG tablet Take 1 tablet (500 mg total) by mouth 2 (two) times daily. 02/22/16   Barrett Henle, PA-C  miconazole (MICOTIN) 2 % cream Apply 1 application topically 2 (two) times daily. 02/22/16 02/29/16  Barrett Henle, PA-C    Family History No family history on file.  Social History Social History  Substance Use Topics  . Smoking status: Never Smoker  . Smokeless tobacco: Never Used  .  Alcohol use No     Allergies   Penicillins   Review of Systems Review of Systems  Genitourinary: Positive for dysuria, vaginal discharge (white) and vaginal pain.  Skin: Positive for rash (vaginal region).  All other systems reviewed and are negative.    Physical Exam Updated Vital Signs BP 184/79 (BP Location: Left Arm)   Pulse 65   Temp 98 F (36.7 C) (Oral)   Resp 18   Ht 5\' 3"  (1.6 m)   Wt 49 kg   SpO2 100%   BMI 19.13 kg/m   Physical Exam  Constitutional: She appears well-developed and well-nourished. No distress.  Frail elderly appearing female.  HENT:  Head: Normocephalic and atraumatic.  Mouth/Throat: Oropharynx is clear and moist. No oropharyngeal exudate.  Eyes: Conjunctivae and EOM are normal. Right eye exhibits no discharge. Left eye exhibits no discharge. No scleral icterus.  Neck: Normal range of motion. Neck supple.  Cardiovascular: Normal rate, regular rhythm, normal heart sounds and intact distal pulses.   Pulmonary/Chest: Effort normal and breath sounds normal. No respiratory distress. She has no wheezes. She has no rales. She exhibits no tenderness.  Abdominal: Soft. Bowel sounds are normal. She exhibits no distension and no mass. There is no tenderness. There is no rebound and no guarding. No hernia.  Genitourinary: There is rash and tenderness on the right labia. There is rash and tenderness on the left labia. There is erythema in the vagina. No bleeding in the vagina. Vaginal discharge found.  Genitourinary Comments: Diffuse erythema and swelling noted to vulva and extending to inner thigh with few eroded patches noted in inner folds and satellite lesions present. Mild TTP. White curd-like discharge noted to vulva.  Musculoskeletal: She exhibits no edema.  Neurological: She is alert.  Alert and oriented to person and place only.  Skin: Skin is warm and dry. She is not diaphoretic.  Nursing note and vitals reviewed.  Pelvic exam: normal external  genitalia, vulva, vagina, cervix, uterus and adnexa, VULVA: diffuse erythema, swelling and mild TTP noted to vulva, VAGINA: normal appearing vagina with normal color and discharge, no lesions, vaginal discharge - white and milky, CERVIX: normal appearing cervix without discharge or lesions, UTERUS: uterus is normal size, shape, consistency and nontender, ADNEXA: normal adnexa in size, nontender and no masses, WET MOUNT done - results: clue cells, white blood cells, exam chaperoned by female nurse.   ED Treatments / Results  Labs (all labs ordered are listed, but only abnormal results are displayed) Labs Reviewed  WET PREP, GENITAL - Abnormal; Notable for the following:       Result Value   Clue Cells Wet Prep HPF POC PRESENT (*)    WBC, Wet Prep HPF POC MANY (*)    All other components within normal limits  URINALYSIS, ROUTINE W REFLEX MICROSCOPIC (NOT AT  [facility]) - Abnormal; Notable for the following:    APPearance TURBID (*)    Glucose, UA >1000 (*)    Hgb urine dipstick MODERATE (*)    Ketones, ur 15 (*)    Protein, ur 30 (*)    Leukocytes, UA MODERATE (*)    All other components within normal limits  CBC WITH DIFFERENTIAL/PLATELET - Abnormal; Notable for the following:    RBC 3.81 (*)    Hemoglobin 10.5 (*)    HCT 32.3 (*)    All other components within normal limits  BASIC METABOLIC PANEL - Abnormal; Notable for the following:    Glucose, Bld 307 (*)    BUN 22 (*)    Calcium 8.8 (*)    GFR calc non Af Amer 53 (*)    All other components within normal limits  URINE MICROSCOPIC-ADD ON - Abnormal; Notable for the following:    Squamous Epithelial / LPF 6-30 (*)    Bacteria, UA MANY (*)    All other components within normal limits  URINE CULTURE    EKG  EKG Interpretation None       Radiology No results found.  Procedures Procedures (including critical care time)  Medications Ordered in ED Medications - No data to display   Initial Impression / Assessment and Plan  / ED Course  I have reviewed the triage vital signs and the nursing notes.  Pertinent labs & imaging results that were available during my care of the patient were reviewed by me and considered in my medical decision making (see chart for details).   Patient presents with red tender rash to her pelvic region with associated vaginal discharge for the past 2 weeks. Endorses pain upon palpation or when urine touches affected area. Denies fever, abdominal pain, vomiting, vaginal bleeding. VSS. Exam revealed erythematous rash with satellite lesions and swelling to vulva. Pelvic exam revealed white milkly d/c, remaining exam unremarkable. Abdominal exam benign. Wet prep positive for clue cells and WBCs. UA positive for moderate hgb, glucose, moderate leuks, 6-30 epithelial cells, 6-30 WBCs and many bacteria. Suspect UA is likely contaminated and do not feel that antibiotics need to be initiated at this timebut will send culture for further evaluation. Advised pt to have her UA rechecked by her PCP next week for follow up regarding hgb. Plan to d/c pt home with PO and topical rx for yeast infection due to pt with hx of DM and severity of rash. Pt also given flagyl for BV. Discussed results and plan for d/c. Discussed strict return precautions.   Final Clinical Impressions(s) / ED Diagnoses   Final diagnoses:  Candidal vulvovaginitis  BV (bacterial vaginosis)    New Prescriptions New Prescriptions   FLUCONAZOLE (DIFLUCAN) 200 MG TABLET    Take 1 tablet (200 mg total) by mouth daily.   METRONIDAZOLE (FLAGYL) 500 MG TABLET    Take 1 tablet (500 mg total) by mouth 2 (two) times daily.   MICONAZOLE (MICOTIN) 2 % CREAM    Apply 1 application topically 2 (two) times daily.     Satira Sark Parker, [place] 02/22/16 1631    Nira Conn, MD 02/22/16 [phone]

## 2016-02-22 NOTE — ED Notes (Signed)
Lab reports BMP needs to be redrawn, sample hemolyzed. Hansel StarlingAdrienne, RN made aware

## 2016-02-22 NOTE — ED Triage Notes (Signed)
Daughter reports pt with white vaginal d/c and rash to groin x 1 week-pt of dementia-presents to triage in w/c

## 2016-02-22 NOTE — Discharge Instructions (Signed)
Take your antibiotics as prescribed until completed. You may also apply the cream to her vaginal region twice daily for the next 7 days to help with her yeast infection. Follow up with your primary care provider within the next week for follow-up and to have your rash rechecked. Please return to the Emergency Department if symptoms worsen or new onset of fever, abdominal pain, vomiting, diarrhea, blood in urine or stool, vaginal bleeding, vaginal discharge, new/worsening rash, drainage, decreased oral intake.

## 2016-02-24 LAB — URINE CULTURE

## 2016-05-22 ENCOUNTER — Ambulatory Visit: Payer: Medicare Other | Admitting: Podiatry

## 2016-05-28 ENCOUNTER — Emergency Department (HOSPITAL_BASED_OUTPATIENT_CLINIC_OR_DEPARTMENT_OTHER)
Admission: EM | Admit: 2016-05-28 | Discharge: 2016-05-28 | Disposition: A | Discharge status: Home / Self Care | Payer: Medicare Other | Attending: Emergency Medicine | Admitting: Emergency Medicine

## 2016-05-28 ENCOUNTER — Encounter (HOSPITAL_BASED_OUTPATIENT_CLINIC_OR_DEPARTMENT_OTHER): Payer: Self-pay | Admitting: *Deleted

## 2016-05-28 ENCOUNTER — Emergency Department (HOSPITAL_BASED_OUTPATIENT_CLINIC_OR_DEPARTMENT_OTHER): Payer: Medicare Other

## 2016-05-28 DIAGNOSIS — N390 Urinary tract infection, site not specified: Secondary | ICD-10-CM | POA: Diagnosis not present

## 2016-05-28 DIAGNOSIS — F0390 Unspecified dementia without behavioral disturbance: Secondary | ICD-10-CM | POA: Diagnosis not present

## 2016-05-28 DIAGNOSIS — Z79899 Other long term (current) drug therapy: Secondary | ICD-10-CM | POA: Insufficient documentation

## 2016-05-28 DIAGNOSIS — Z794 Long term (current) use of insulin: Secondary | ICD-10-CM | POA: Insufficient documentation

## 2016-05-28 DIAGNOSIS — I10 Essential (primary) hypertension: Secondary | ICD-10-CM | POA: Diagnosis not present

## 2016-05-28 DIAGNOSIS — R4182 Altered mental status, unspecified: Secondary | ICD-10-CM | POA: Diagnosis present

## 2016-05-28 LAB — CBC WITH DIFFERENTIAL/PLATELET
BASOS ABS: 0 10*3/uL (ref 0.0–0.1)
BASOS PCT: 0 %
EOS ABS: 0 10*3/uL (ref 0.0–0.7)
EOS PCT: 0 %
HCT: 35.7 % — ABNORMAL LOW (ref 36.0–46.0)
Hemoglobin: 11.5 g/dL — ABNORMAL LOW (ref 12.0–15.0)
LYMPHS PCT: 33 %
Lymphs Abs: 1.5 10*3/uL (ref 0.7–4.0)
MCH: 27.1 pg (ref 26.0–34.0)
MCHC: 32.2 g/dL (ref 30.0–36.0)
MCV: 84.2 fL (ref 78.0–100.0)
MONO ABS: 0.4 10*3/uL (ref 0.1–1.0)
Monocytes Relative: 8 %
Neutro Abs: 2.7 10*3/uL (ref 1.7–7.7)
Neutrophils Relative %: 59 %
PLATELETS: 184 10*3/uL (ref 150–400)
RBC: 4.24 MIL/uL (ref 3.87–5.11)
RDW: 14.6 % (ref 11.5–15.5)
WBC: 4.6 10*3/uL (ref 4.0–10.5)

## 2016-05-28 LAB — URINALYSIS, MICROSCOPIC (REFLEX)

## 2016-05-28 LAB — URINALYSIS, ROUTINE W REFLEX MICROSCOPIC
BILIRUBIN URINE: NEGATIVE
GLUCOSE, UA: 250 mg/dL — AB
KETONES UR: 40 mg/dL — AB
Nitrite: NEGATIVE
PROTEIN: 100 mg/dL — AB
Specific Gravity, Urine: 1.02 (ref 1.005–1.030)
pH: 5.5 (ref 5.0–8.0)

## 2016-05-28 LAB — COMPREHENSIVE METABOLIC PANEL
ALBUMIN: 3.3 g/dL — AB (ref 3.5–5.0)
ALT: 14 U/L (ref 14–54)
AST: 20 U/L (ref 15–41)
Alkaline Phosphatase: 57 U/L (ref 38–126)
Anion gap: 14 (ref 5–15)
BUN: 32 mg/dL — AB (ref 6–20)
CHLORIDE: 105 mmol/L (ref 101–111)
CO2: 22 mmol/L (ref 22–32)
CREATININE: 0.94 mg/dL (ref 0.44–1.00)
Calcium: 9.1 mg/dL (ref 8.9–10.3)
GFR calc Af Amer: >60 mL/min (ref >60)
GFR, EST NON AFRICAN AMERICAN: 53 mL/min — AB (ref >60)
Glucose, Bld: 370 mg/dL — ABNORMAL HIGH (ref 65–99)
POTASSIUM: 4.5 mmol/L (ref 3.5–5.1)
SODIUM: 141 mmol/L (ref 135–145)
Total Bilirubin: 0.6 mg/dL (ref 0.3–1.2)
Total Protein: 7 g/dL (ref 6.5–8.1)

## 2016-05-28 LAB — CBG MONITORING, ED: GLUCOSE-CAPILLARY: 344 mg/dL — AB (ref 65–99)

## 2016-05-28 MED ORDER — CEPHALEXIN 250 MG PO CAPS
500.0000 mg | ORAL_CAPSULE | Freq: Once | ORAL | Status: AC
  Administered 2016-05-28: 500 mg via ORAL
  Filled 2016-05-28: qty 2

## 2016-05-28 MED ORDER — CEPHALEXIN 500 MG PO CAPS: 500.0000 mg | ORAL_CAPSULE | Freq: Two times a day (BID) | ORAL | 0 refills | Status: AC

## 2016-05-28 MED ORDER — SODIUM CHLORIDE 0.9 % IV BOLUS (SEPSIS)
1000.0000 mL | Freq: Once | INTRAVENOUS | Status: AC
  Administered 2016-05-28: 1000 mL via INTRAVENOUS

## 2016-05-28 NOTE — ED Triage Notes (Signed)
Daughter states? UTI , increased weakness and confusion  X 5 days

## 2016-05-28 NOTE — ED Provider Notes (Signed)
[facility] Provider Note   CSN: [id number] Arrival date & time: 05/28/16  1436     History   Chief Complaint Chief Complaint  Patient presents with  . Altered Mental Status    HPI Katelyn Suarez is a 83 y.o. female.  HPI  83 year old female who presents with confusion. She has a history of dementia, type 2 diabetes, hypertension, and hyperlipidemia. History is provided by patient's daughter who primarily takes care of her. States that 3-4 days ago, her mother had notably decreased appetite, not wanting to eat or drink like she normally does. she also states that she is less verbal than normally, and inattentive when she is talking to her. States that this is similar to when she has had UTI in the past. She has not had fall, but states that she requires more assistance with transfer due to generalized weakness, but normally she is wheelchair bound. Denies any fevers, nausea or vomiting, diarrhea, chest pain, or difficulty breathing. She is incontinent of urine, but she denies any dysuria or urinary frequency.  Past Medical History:  Diagnosis Date  . Anemia   . Dementia   . Diabetes mellitus without complication (HCC)    Type II  . GERD (gastroesophageal reflux disease)   . High cholesterol   . Hypertension     Patient Active Problem List   Diagnosis Date Noted  . Onychomycosis 08/09/2015  . Hyperthyroidism 03/19/2014  . Essential hypertension 03/12/2014  . Diabetes mellitus with neuropathy (HCC) 03/12/2014  . Protein-calorie malnutrition, severe (HCC) 02/24/2013    Past Surgical History:  Procedure Laterality Date  . ABDOMINAL HYSTERECTOMY    . CHOLECYSTECTOMY    . JOINT REPLACEMENT     right hip    OB History    No data available       Home Medications    Prior to Admission medications   Medication Sig Start Date End Date Taking? Authorizing Provider  aspirin 81 MG tablet Take 81 mg by mouth daily.    Historical Provider, MD  bimatoprost  (LUMIGAN) 0.03 % ophthalmic solution Place 1 drop into both eyes at bedtime.    Historical Provider, MD  brimonidine (ALPHAGAN P) 0.1 % SOLN Place 1 drop into the left eye 3 (three) times daily.    Historical Provider, MD  cephALEXin (KEFLEX) 500 MG capsule Take 1 capsule (500 mg total) by mouth 2 (two) times daily. 05/28/16   Lavera Guise, MD  dorzolamide (TRUSOPT) 2 % ophthalmic solution Place 1 drop into the left eye 3 (three) times daily.     Historical Provider, MD  gabapentin (NEURONTIN) 100 MG capsule Take 100 mg by mouth 3 (three) times daily.     Historical Provider, MD  insulin glargine (LANTUS) 100 UNIT/ML injection Inject 8 Units into the skin at bedtime.    Historical Provider, MD  insulin lispro (HUMALOG) 100 UNIT/ML injection Inject 2 Units into the skin 3 (three) times daily before meals.    Historical Provider, MD  insulin lispro (HUMALOG) 100 UNIT/ML injection Inject into the skin 4 (four) times daily -  before meals and at bedtime. Sliding scale: 150-199=1 unit; 200-249=2 units; 250-299=3 units; 300-349=4 units; >349 5 units and notify MD    Historical Provider, MD  lisinopril (PRINIVIL,ZESTRIL) 5 MG tablet Take 5 mg by mouth 2 (two) times daily.    Historical Provider, MD  memantine (NAMENDA) 5 MG tablet Take 5 mg by mouth 2 (two) times daily.    Historical Provider,  MD  methazolamide (NEPTAZANE) 25 MG tablet Take 50 mg by mouth daily.     Historical Provider, MD  metoprolol tartrate (LOPRESSOR) 25 MG tablet Take 25 mg by mouth 2 (two) times daily.    Historical Provider, MD  metroNIDAZOLE (FLAGYL) 500 MG tablet Take 1 tablet (500 mg total) by mouth 2 (two) times daily. 02/22/16   Barrett HenleNicole Elizabeth Nadeau, PA-C    Family History History reviewed. No pertinent family history.  Social History Social History  Substance Use Topics  . Smoking status: Never Smoker  . Smokeless tobacco: Never Used  . Alcohol use No     Allergies   Penicillins   Review of Systems Review of  Systems Unable to be obtained due to dementia and mental status changes  Physical Exam Updated Vital Signs BP 171/91 (BP Location: Right Arm)   Pulse 75   Temp 97.9 F (36.6 C) (Oral)   Resp 16   Ht 5\' 4"  (1.626 m)   Wt 105 lb (47.6 kg)   SpO2 100%   BMI 18.02 kg/m   Physical Exam Physical Exam  Nursing note and vitals reviewed. Constitutional: Well developed, well nourished, non-toxic, and in no acute distress Head: Normocephalic and atraumatic.  Mouth/Throat: Oropharynx is clear and dry mucous membranes.  Neck: Normal range of motion. Neck supple.  Cardiovascular: Normal rate and regular rhythm.  no edema Pulmonary/Chest: Effort normal and breath sounds normal.  Abdominal: Soft. There is no tenderness. There is no rebound and no guarding.  Musculoskeletal: Normal range of motion.  Neurological: Alert, no facial droop, fluent speech, moves all extremities symmetrically Skin: Skin is warm and dry.  Psychiatric: Cooperative   ED Treatments / Results  Labs (all labs ordered are listed, but only abnormal results are displayed) Labs Reviewed  URINALYSIS, ROUTINE W REFLEX MICROSCOPIC - Abnormal; Notable for the following:       Result Value   APPearance TURBID (*)    Glucose, UA 250 (*)    Hgb urine dipstick LARGE (*)    Ketones, ur 40 (*)    Protein, ur 100 (*)    Leukocytes, UA LARGE (*)    All other components within normal limits  CBC WITH DIFFERENTIAL/PLATELET - Abnormal; Notable for the following:    Hemoglobin 11.5 (*)    HCT 35.7 (*)    All other components within normal limits  COMPREHENSIVE METABOLIC PANEL - Abnormal; Notable for the following:    Glucose, Bld 370 (*)    BUN 32 (*)    Albumin 3.3 (*)    GFR calc non Af Amer 53 (*)    All other components within normal limits  URINALYSIS, MICROSCOPIC (REFLEX) - Abnormal; Notable for the following:    Bacteria, UA FEW (*)    Squamous Epithelial / LPF 0-5 (*)    All other components within normal limits    CBG MONITORING, ED - Abnormal; Notable for the following:    Glucose-Capillary 344 (*)    All other components within normal limits  URINE CULTURE    EKG  EKG Interpretation None       Radiology Dg Chest 2 View  Result Date: 05/28/2016 CLINICAL DATA:  Patient with weakness and confusion. EXAM: CHEST  2 VIEW COMPARISON:  Chest radiograph 05/29/2015. FINDINGS: Stable cardiac and mediastinal contours. Aortic atherosclerosis. Tortuosity of the thoracic aorta. No consolidative pulmonary opacities. No pleural effusion or pneumothorax. Thoracic spine degenerative changes. IMPRESSION: No active cardiopulmonary disease.  Aortic atherosclerosis. Electronically Signed   By:  Annia Belt M.D.   On: 05/28/2016 15:31   Ct Head Wo Contrast  Result Date: 05/28/2016 CLINICAL DATA:  History of dementia. Worsening confusion over the last 5 days. EXAM: CT HEAD WITHOUT CONTRAST TECHNIQUE: Contiguous axial images were obtained from the base of the skull through the vertex without intravenous contrast. COMPARISON:  04/03/2016 FINDINGS: Brain: Generalized brain atrophy. Chronic small-vessel ischemic changes of the hemispheric white matter. Old lacunar infarctions in the basal ganglia more extensive on the left than the right. This was not present in November, but has an appearance that does not look acute. No evidence of mass lesion, hemorrhage, hydrocephalus or extra-axial collection. Vascular: There is atherosclerotic calcification of the major vessels at the base of the brain. Skull: Negative Sinuses/Orbits: Clear sinuses. Chronic degenerative changes of the right globe. Other: Arthropathy at the C1-2 articulation with mild encroachment upon the spinal canal. IMPRESSION: All no acute finding by CT. Atrophy and chronic small-vessel ischemic changes. Left basal ganglia infarction that was not present in November of 2017, but does not look acute by today's CT study. Presumably, this occurred in the interim.  Electronically Signed   By: Paulina Fusi M.D.   On: 05/28/2016 15:30    Procedures Procedures (including critical care time)  Medications Ordered in ED Medications  sodium chloride 0.9 % bolus 1,000 mL (0 mLs Intravenous Stopped 05/28/16 1719)  cephALEXin (KEFLEX) capsule 500 mg (500 mg Oral Given 05/28/16 1806)     Initial Impression / Assessment and Plan / ED Course  I have reviewed the triage vital signs and the nursing notes.  Pertinent labs & imaging results that were available during my care of the patient were reviewed by me and considered in my medical decision making (see chart for details).  Clinical Course     84 year old female who presents with decreased appetite and inattentiveness. She is Nontoxic and in no acute distress. Afebrile and hemodynamically stable. She is grossly neurologically intact. Exam overall non-focal. Currently mental status normal per daughter.  Blood work does not reveal evidence of electrolyte or major metabolic derangements. She does have some hyperglycemia,but no evidence of DKA. Her daughter states that because she has not been eating as much she has not been giving her insulin. She is encouraged to continue giving medications.  CT head visualized as not show acute intracranial processes. Chest x-ray visualized shows no evidence of pneumonia, edema, or other acute cardiopulmonary processes. Catheterized urinalysis does show evidence of urinary tract infection and it is sent for culture. This is likely causing her symptoms today. She has no leukocytosis or signs and symptoms of sepsis.  Patient's daughter states she feels comfortable managing her symptoms from home with outpatient antibiotics, and will arrange close PCP follow-up for recheck. Given mental status currently stable w/o evidence of sepsis, will treat with keflex as outpatient. Discussed strict return instructions that would warrant re-examination and potential admission. Her daughter  expressed understanding of discharge instructions, and felt comfortable with the plan of care.     Final Clinical Impressions(s) / ED Diagnoses   Final diagnoses:  Lower urinary tract infectious disease    New Prescriptions Discharge Medication List as of 05/28/2016  5:58 PM       Lavera Guise, MD 05/29/16 [phone]

## 2016-05-28 NOTE — Discharge Instructions (Signed)
Please take antibiotics as prescribed. You have an UTI. Continue to take insulin for your diabetes.   Return for worsening symptoms, including fever, worsening confusion, intractable vomiting, frequent falls or any other symptoms concerning to you.

## 2016-05-28 NOTE — ED Notes (Signed)
Pt in CT.

## 2016-05-28 NOTE — ED Notes (Signed)
CBG-[id number] 

## 2016-05-30 LAB — URINE CULTURE: Culture: >=100000 — AB

## 2016-05-31 ENCOUNTER — Telehealth (HOSPITAL_BASED_OUTPATIENT_CLINIC_OR_DEPARTMENT_OTHER): Payer: Self-pay

## 2016-05-31 NOTE — Telephone Encounter (Signed)
Post ED Visit - Positive Culture Follow-up: Successful Patient Follow-Up  Culture assessed and recommendations reviewed by: []  Enzo BiNathan Batchelder, Pharm.D. [x]  Celedonio MiyamotoJeremy Frens, Pharm.D., BCPS []  Garvin FilaMike Maccia, Pharm.D. []  Georgina PillionElizabeth Martin, Pharm.D., BCPS []  AdamsburgMinh Pham, [address] Rainbow BoulevardPharm.D., BCPS, AAHIVP []  Estella HuskMichelle Turner, Pharm.D., BCPS, AAHIVP []  Tennis Mustassie Stewart, Pharm.D. []  Sherle Poeob Vincent, [place]D.  Positive urine culture, >/= 100,000 colonies -> Ecoli  []  Patient discharged without antimicrobial prescription and treatment is now indicated [x]  Organism is resistant to prescribed ED discharge antimicrobial []  Patient with positive blood cultures  Changes discussed with ED provider: Arthor CaptainAbigail Harris PA New antibiotic prescription "Stop Cephalexin, contact family; if symptoms remain, flucanazole 200 mg po daily x 2 wks" [phone], Rx given to R[facility].  Contacted patient, date 05/31/16, time 1128  Pts daughter says pt still symptomatic.  [facility]   Karanveer Ramakrishnan, Olene Cravenatricia Dorn 05/31/2016, 11:57 AM

## 2016-07-24 ENCOUNTER — Ambulatory Visit (INDEPENDENT_AMBULATORY_CARE_PROVIDER_SITE_OTHER): Payer: Medicare Other | Admitting: Podiatry

## 2016-07-24 ENCOUNTER — Encounter: Payer: Self-pay | Admitting: Podiatry

## 2016-07-24 DIAGNOSIS — L97509 Non-pressure chronic ulcer of other part of unspecified foot with unspecified severity: Secondary | ICD-10-CM

## 2016-07-24 DIAGNOSIS — E1152 Type 2 diabetes mellitus with diabetic peripheral angiopathy with gangrene: Secondary | ICD-10-CM

## 2016-07-24 DIAGNOSIS — E11621 Type 2 diabetes mellitus with foot ulcer: Secondary | ICD-10-CM

## 2016-07-24 NOTE — Progress Notes (Signed)
SUBJECTIVE: 83 y.o. year old female presents via wheel chair escorted by her grandson who lives with her, for follow up on right foot lesion.  Patient has Duoderm patches over right midfoot and heel at medial aspect.  The grandson noted of her toes are getting black and took her to hospital. Hospital discharged her with follow up appointment at this office. She was seen prior to this on 02/20/17 for routine foot care.  Patient is IDDM.  OBJECTIVE: DERMATOLOGIC EXAMINATION: Thick dystrophic nails x 10.  Positive of dry gangrene distal medial 1/2 of left great toe covering about 1.5 x 1.5 cm. Positive of pre gangrenous hard dark brown skin the first metatarsal head right foot. Positive for draining open sore at center medial aspect left foot with loss of subdermal layer, 1.3 cm in diameter. Positive of gangrenous medial heel right foot.  VASCULAR EXAMINATION OF LOWER LIMBS: All pedal pulses are not palpable on both feet.   NEUROLOGIC EXAMINATION OF THE LOWER LIMBS: No response to the sensory testing.   MUSCULOSKELETAL EXAMINATION: Multiple contracted lesser digits on both feet.  ASSESSMENT: 1. PVD. 2. Dry gangrene x 3 lesions. 3. Draining ulcer right mid foot, 1.3 cm deep to subdermal layer. 4. Onychomycosis x 10.  5. Diabetic neuropathy.  PLAN: Reviewed findings and available treatment options, wound care center and home care for diabetic ulcer with grandson. All nails debrided. Open wound redressed with Amerigel ointment. Will make arrangement with diabetic wound care specialty company to have daily wound care done at home.

## 2016-07-24 NOTE — Patient Instructions (Signed)
Was in hospital for right foot wound and left great toe gangrene. Will make home wound care arrangement. Return in one month.

## 2016-08-21 ENCOUNTER — Ambulatory Visit: Payer: Medicare Other | Admitting: Podiatry

## 2016-08-23 ENCOUNTER — Encounter: Payer: Self-pay | Admitting: Podiatry

## 2016-08-23 ENCOUNTER — Ambulatory Visit (INDEPENDENT_AMBULATORY_CARE_PROVIDER_SITE_OTHER): Payer: Medicare Other | Admitting: Podiatry

## 2016-08-23 DIAGNOSIS — E1152 Type 2 diabetes mellitus with diabetic peripheral angiopathy with gangrene: Secondary | ICD-10-CM

## 2016-08-23 DIAGNOSIS — L97509 Non-pressure chronic ulcer of other part of unspecified foot with unspecified severity: Secondary | ICD-10-CM | POA: Diagnosis not present

## 2016-08-23 DIAGNOSIS — L97301 Non-pressure chronic ulcer of unspecified ankle limited to breakdown of skin: Secondary | ICD-10-CM

## 2016-08-23 DIAGNOSIS — E11621 Type 2 diabetes mellitus with foot ulcer: Secondary | ICD-10-CM

## 2016-08-23 NOTE — Patient Instructions (Signed)
Follow up on bilateral foot ulcer. Doing home care with antibiotic foot soak and cream. Continue current level of care.  Surgical shoe dispensed for right foot. Return in one month.

## 2016-08-23 NOTE — Progress Notes (Signed)
SUBJECTIVE: 83 y.o. year old female presents via wheel chair escorted by her daughter.  Daughter stated that they received home care supply from specialty wound care center, antibiotic soaking solution and wound cream for daily dressing. Been doing daily wound care according to instruction. Patient is IDDM and not coherent.  OBJECTIVE: All foot lesions are dry and covered with dry gangrenous hard scab. No or minimum drainage noted. Positive of dry gangrene distal medial 1/2 of left great toe covering about 1.5 x 1.5 cm. Positive of gangrenous hard dark brown skin the first metatarsal head right foot. Positive for dry ulcer base at medial proximal wound 1.3 cm in diameter.  VASCULAR EXAMINATION OF LOWER LIMBS: All pedal pulses are not palpable on both feet.  No edema or erythema noted. NEUROLOGIC EXAMINATION OF THE LOWER LIMBS: No response to the sensory testing.   MUSCULOSKELETAL EXAMINATION: Multiple contracted lesser digits on both feet.  ASSESSMENT: 1. PVD. 2. Dry gangrene bilateral foot x 3 lesions without active open tissue or drainage. 3. Diabetic neuropathy.  PLAN: Reviewed findings and available treatment options, wound care center and home care for diabetic ulcer with grandson. Continue current level of care. Right foot wound redressed with Amerigel ointment. Continue with diabetic wound care with supplied provided by specialty company to have daily wound care done at home. Right foot placed in surgical shoe.

## 2016-09-25 ENCOUNTER — Ambulatory Visit: Payer: Medicare Other | Admitting: Podiatry

## 2016-10-26 DEATH — deceased

## 2018-04-11 IMAGING — CT CT HEAD W/O CM
3 series · 15 of 47 positions shown, 18 images · non-contrast
Comparison: 04/03/2016

CLINICAL DATA: History of dementia. Worsening confusion over the
last 5 days.

EXAM:
CT HEAD WITHOUT CONTRAST
TECHNIQUE: Contiguous axial images were obtained from the base of the skull
through the vertex without intravenous contrast.

[Series 2: head wo · axial · 0.44mm/px · z∈[-161,-36]mm · 9 of 31 slices shown, 12 images]
[im 3/31  brain]
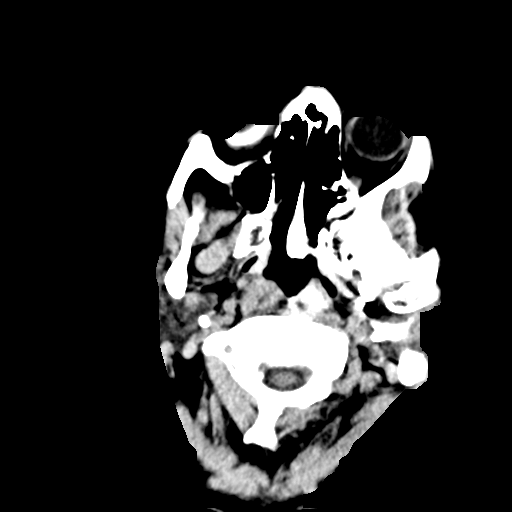
[im 3/31  bone]
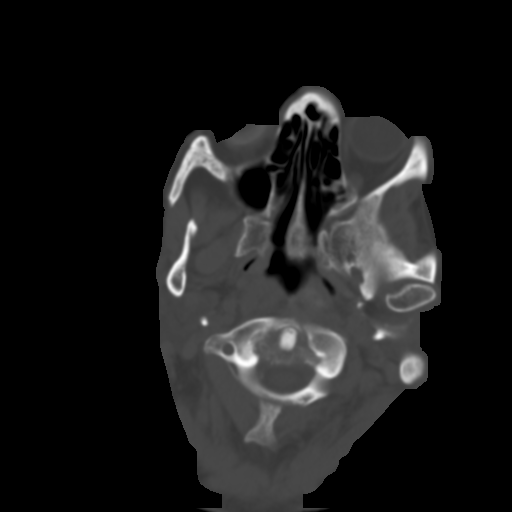
[im 6/31  brain]
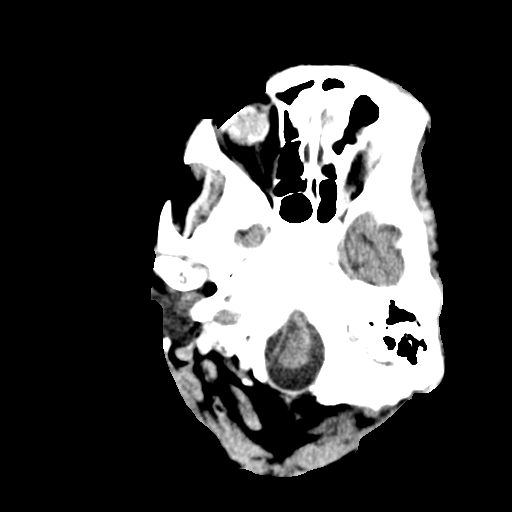
[im 9/31  brain]
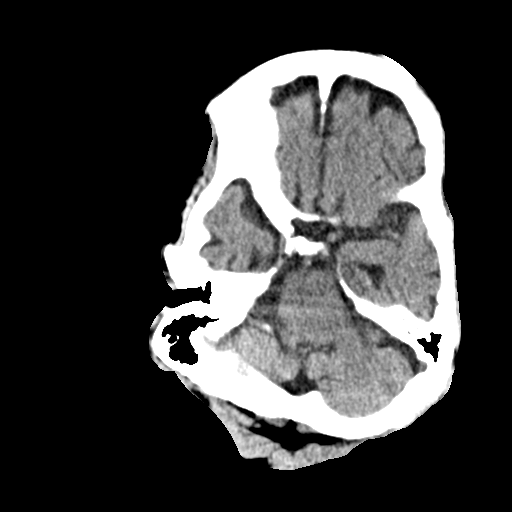
[im 12/31  brain]
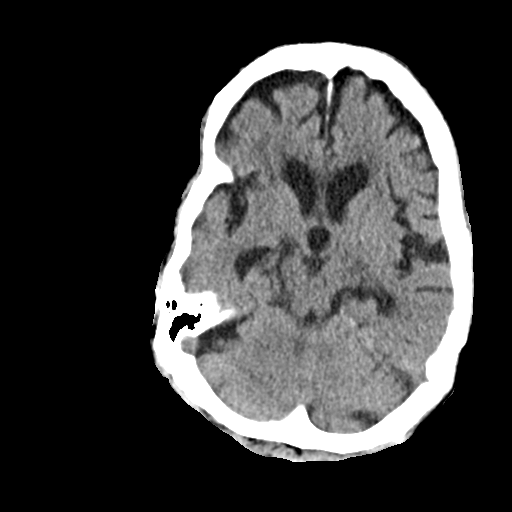
[im 16/31  brain]
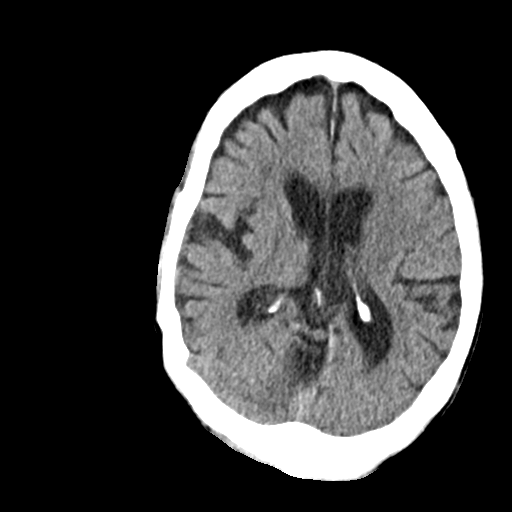
[im 16/31  bone]
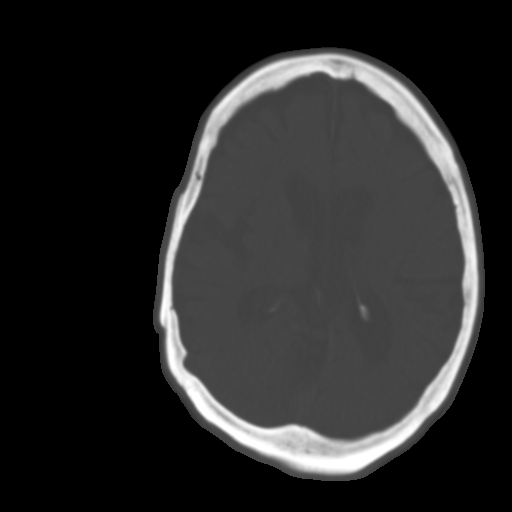
[im 19/31  brain]
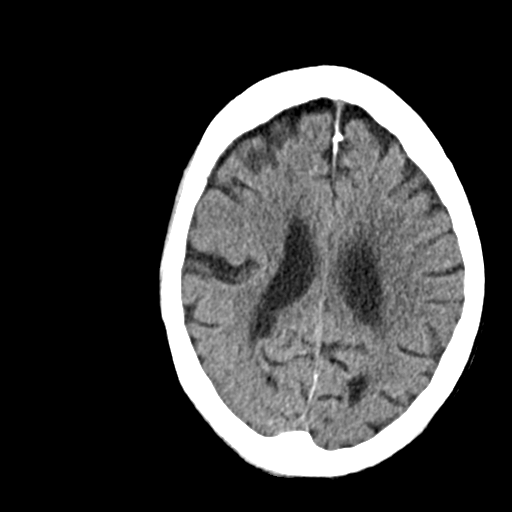
[im 22/31  brain]
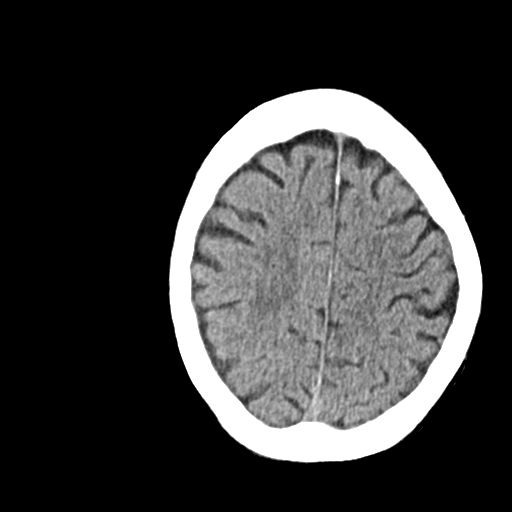
[im 25/31  brain]
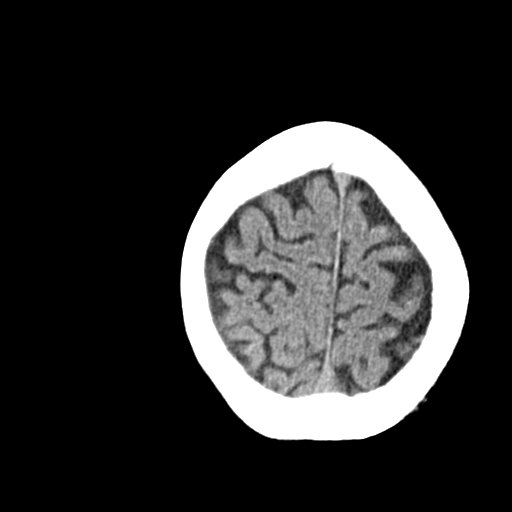
[im 28/31  brain]
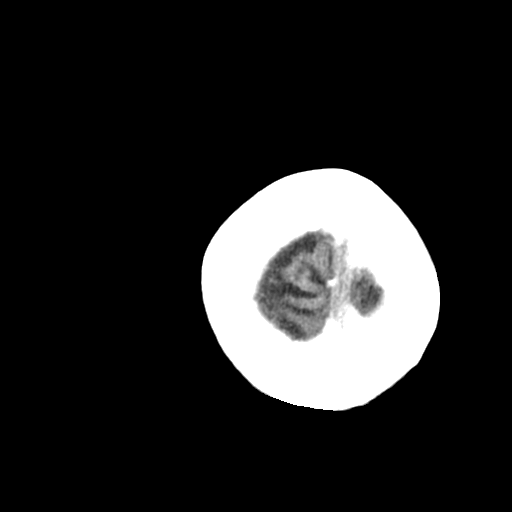
[im 28/31  bone]
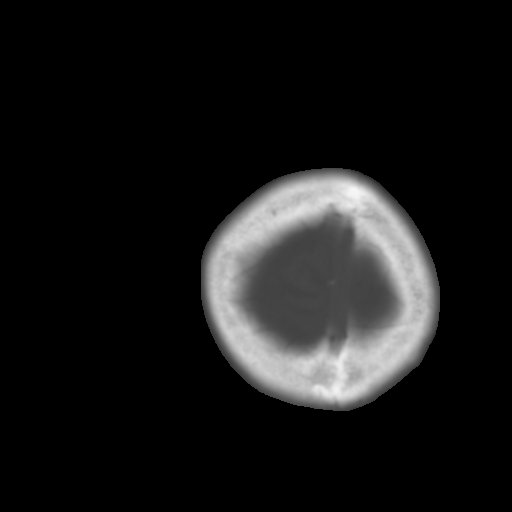

[Series 4: coronal soft · coronal · 0.34mm/px · 3 of 67 slices shown]
[im 23/67  brain]
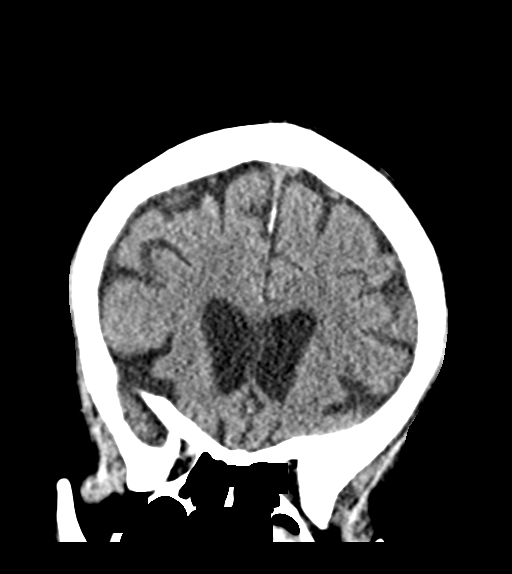
[im 30/67  brain]
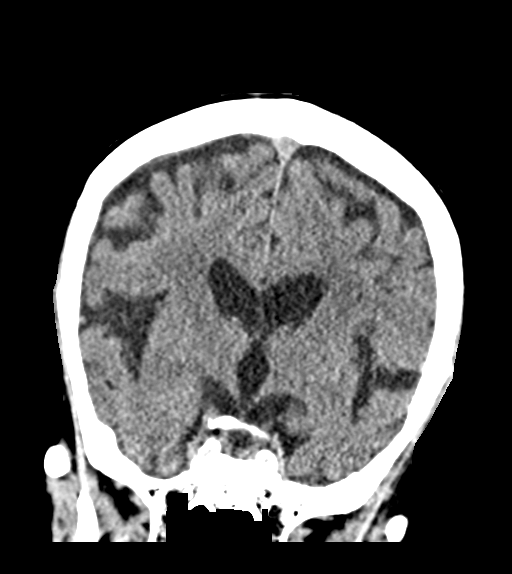
[im 37/67  brain]
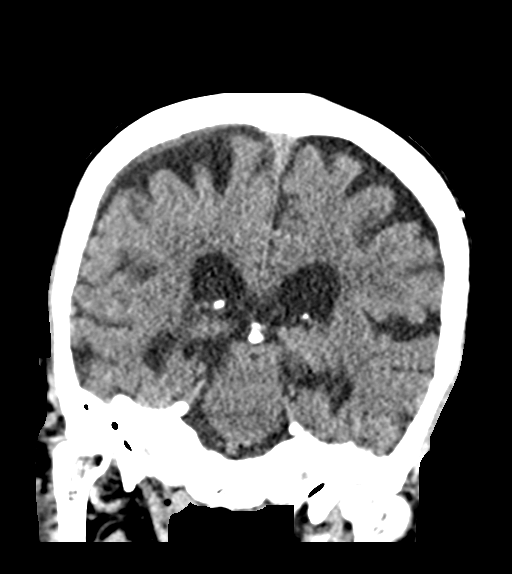

[Series 5: sag soft · sagittal · 0.38mm/px · 3 of 53 slices shown]
[im 18/53  brain]
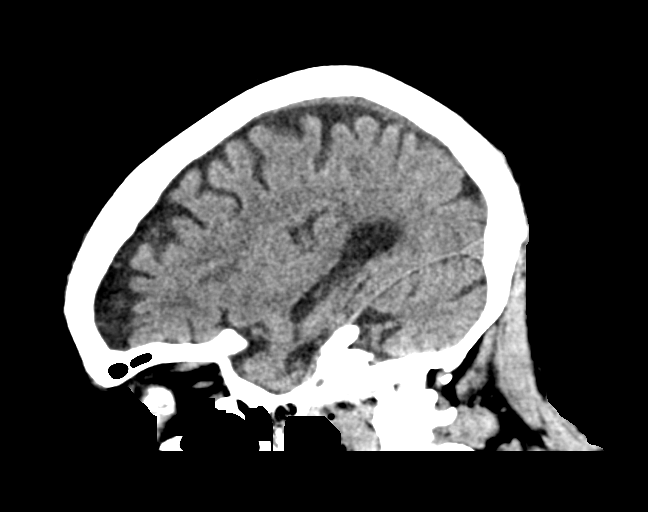
[im 27/53  brain]
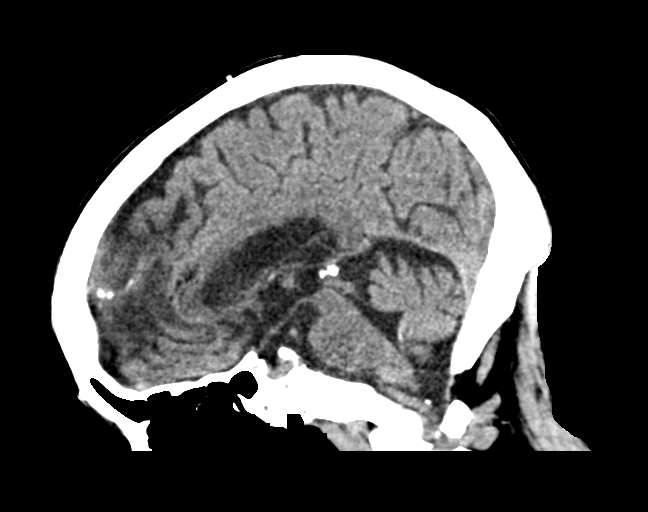
[im 35/53  brain]
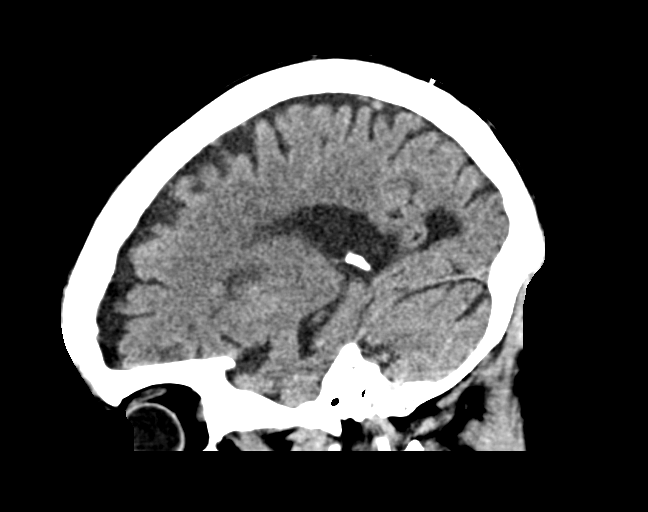

[15 of 47 positions shown; findings below may reference images not displayed]

FINDINGS: Brain: Generalized brain atrophy. Chronic small-vessel ischemic
changes of the hemispheric white matter. Old lacunar infarctions in
the basal ganglia more extensive on the left than the right. This
was not present in [REDACTED], but has an appearance that does not
look acute. No evidence of mass lesion, hemorrhage, hydrocephalus or
extra-axial collection.

Vascular: There is atherosclerotic calcification of the major
vessels at the base of the brain.

Skull: Negative

Sinuses/Orbits: Clear sinuses. Chronic degenerative changes of the
right globe.

Other: Arthropathy at the C1-2 articulation with mild encroachment
upon the spinal canal.
IMPRESSION: All no acute finding by CT. Atrophy and chronic small-vessel
ischemic changes. Left basal ganglia infarction that was not present
in Monday March, 2016, but does not look acute by today's CT study.
Presumably, this occurred in the interim.
